# Patient Record
Sex: Female | Born: 1985
Health system: Southern US, Community
[De-identification: ages and names within clinical notes are randomized; demographics above are authoritative.]

## PROBLEM LIST (undated history)

## (undated) ENCOUNTER — Inpatient Hospital Stay (HOSPITAL_COMMUNITY): Payer: Self-pay

## (undated) DIAGNOSIS — E119 Type 2 diabetes mellitus without complications: Secondary | ICD-10-CM

## (undated) HISTORY — PX: WISDOM TOOTH EXTRACTION: SHX21

## (undated) HISTORY — PX: NO PAST SURGERIES: SHX2092

---
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
## 2010-08-08 ENCOUNTER — Inpatient Hospital Stay (HOSPITAL_COMMUNITY): Admit: 2010-08-08 | Payer: Self-pay

## 2012-10-05 DIAGNOSIS — F32A Depression, unspecified: Secondary | ICD-10-CM | POA: Insufficient documentation

## 2012-10-05 DIAGNOSIS — F3163 Bipolar disorder, current episode mixed, severe, without psychotic features: Secondary | ICD-10-CM | POA: Insufficient documentation

## 2013-06-29 ENCOUNTER — Emergency Department (HOSPITAL_BASED_OUTPATIENT_CLINIC_OR_DEPARTMENT_OTHER)
Admission: EM | Admit: 2013-06-29 | Discharge: 2013-06-29 | Disposition: A | Discharge status: Home / Self Care | Payer: Medicaid Other | Attending: Emergency Medicine | Admitting: Emergency Medicine

## 2013-06-29 ENCOUNTER — Emergency Department (HOSPITAL_BASED_OUTPATIENT_CLINIC_OR_DEPARTMENT_OTHER): Payer: Medicaid Other

## 2013-06-29 ENCOUNTER — Encounter (HOSPITAL_BASED_OUTPATIENT_CLINIC_OR_DEPARTMENT_OTHER): Payer: Self-pay | Admitting: Emergency Medicine

## 2013-06-29 DIAGNOSIS — J209 Acute bronchitis, unspecified: Secondary | ICD-10-CM

## 2013-06-29 DIAGNOSIS — J40 Bronchitis, not specified as acute or chronic: Secondary | ICD-10-CM | POA: Insufficient documentation

## 2013-06-29 DIAGNOSIS — J9801 Acute bronchospasm: Secondary | ICD-10-CM | POA: Insufficient documentation

## 2013-06-29 MED ORDER — ALBUTEROL SULFATE HFA 108 (90 BASE) MCG/ACT IN AERS
2.0000 | INHALATION_SPRAY | RESPIRATORY_TRACT | Status: DC | PRN
  Filled 2013-06-29: qty 6.7

## 2013-06-29 MED ORDER — PREDNISONE 5 MG PO TABS: 20.0000 mg | ORAL_TABLET | Freq: Two times a day (BID) | ORAL | Status: DC

## 2013-06-29 MED ORDER — PREDNISONE 20 MG PO TABS
40.0000 mg | ORAL_TABLET | Freq: Once | ORAL | Status: AC
  Administered 2013-06-29: 40 mg via ORAL
  Filled 2013-06-29: qty 2

## 2013-06-29 MED ORDER — IPRATROPIUM-ALBUTEROL 0.5-2.5 (3) MG/3ML IN SOLN
3.0000 mL | RESPIRATORY_TRACT | Status: DC
  Administered 2013-06-29: 3 mL via RESPIRATORY_TRACT
  Filled 2013-06-29: qty 3

## 2013-06-29 MED ORDER — BENZONATATE 100 MG PO CAPS: 200.0000 mg | ORAL_CAPSULE | Freq: Three times a day (TID) | ORAL | Status: DC

## 2013-06-29 NOTE — ED Provider Notes (Signed)
Medical screening examination/treatment/procedure(s) were performed by non-physician practitioner and as supervising physician I was immediately available for consultation/collaboration.   EKG Interpretation None        Richardean Canalavid H Yao, MD 06/29/13 2309

## 2013-06-29 NOTE — ED Provider Notes (Signed)
CSN: [id number]     Arrival date & time 06/29/13  1807 History   First MD Initiated Contact with Patient 06/29/13 1910     Chief Complaint  Patient presents with  . Hoarse  . Shortness of Breath     (Consider location/radiation/quality/duration/timing/severity/associated sxs/prior Treatment) Patient is a 28 y.o. female presenting with shortness of breath. The history is provided by the patient.  Shortness of Breath Severity:  Mild Onset quality:  Gradual Duration:  3 days Timing:  Constant Progression:  Unchanged Chronicity:  New Relieved by:  None tried Worsened by:  Nothing tried Ineffective treatments:  None tried Associated symptoms: cough and sore throat   Associated symptoms: no abdominal pain, no chest pain, no ear pain, no fever, no headaches, no neck pain, no rash and no vomiting   Joanna Neal is a 28 y.o. female who presents to the ED with cough and wheezing that started 3 days ago. She states she started with a sore throat and then the wheezing and feeling short of breath. She denies chest pain, nausea, vomiting or other problems. Her children have been sick as well.   No past medical history on file. No past surgical history on file. No family history on file. History  Substance Use Topics  . Smoking status: Never Smoker   . Smokeless tobacco: Not on file  . Alcohol Use: Not on file   OB History   Grav Para Term Preterm Abortions TAB SAB Ect Mult Living                 Review of Systems  Constitutional: Positive for chills. Negative for fever.  HENT: Positive for congestion and sore throat. Negative for ear pain and trouble swallowing.   Eyes: Negative for visual disturbance.  Respiratory: Positive for cough and shortness of breath.   Cardiovascular: Negative for chest pain.  Gastrointestinal: Negative for nausea, vomiting and abdominal pain.  Genitourinary: Negative for dysuria and frequency.  Musculoskeletal: Negative for back pain and neck pain.   Skin: Negative for rash.  Neurological: Negative for syncope and headaches.  Psychiatric/Behavioral: Negative for confusion.      Allergies  Review of patient's allergies indicates no known allergies.  Home Medications  No current outpatient prescriptions on file. BP 139/73  Pulse 74  Temp(Src) 98.4 F (36.9 C) (Oral)  Resp 16  Ht 5\' 7"  (1.702 m)  Wt 310 lb (140.615 kg)  BMI 48.54 kg/m2  SpO2 100% Physical Exam  Nursing note and vitals reviewed. Constitutional: She is oriented to person, place, and time. She appears well-developed and well-nourished. No distress.  HENT:  Head: Normocephalic and atraumatic.  Right Ear: Tympanic membrane normal.  Left Ear: Tympanic membrane normal.  Nose: Mucosal edema and rhinorrhea present.  Mouth/Throat: Uvula is midline, oropharynx is clear and moist and mucous membranes are normal.  Eyes: EOM are normal.  Neck: Neck supple.  Pulmonary/Chest: Effort normal. She has wheezes. She has no rhonchi. She has no rales.  Abdominal: Soft. There is no tenderness.  Musculoskeletal: Normal range of motion.  Neurological: She is alert and oriented to person, place, and time. No cranial nerve deficit.  Skin: Skin is warm and dry.  Psychiatric: She has a normal mood and affect. Her behavior is normal.    ED Course  Procedures albuterol/atrovent neb. Treatment  After treatment patient feeling better without wheezing.  MDM  28 y.o. female with cough and wheezing x 2 days. Improved with neb treatment. Will d/c home with  albuterol inhaler, prednisone and Tessalon Pearls. She will follow up with her PCP or return here for worsening symptoms.  Discussed with the patient and all questioned fully answered.   Medication List         benzonatate 100 MG capsule  Commonly known as:  TESSALON  Take 2 capsules (200 mg total) by mouth every 8 (eight) hours.     predniSONE 5 MG tablet  Commonly known as:  DELTASONE  Take 4 tablets (20 mg total) by mouth 2  (two) times daily.           [place]anch[place] San DiegoHope M Liddy Deam, [place]NP 06/29/13 2047

## 2013-06-29 NOTE — ED Notes (Signed)
Pt states she started with sore throat on Friday.  Now is hoarse and some difficulty with breathing, feels like she has to take a deep breath to relax.  Some tightness and wheezing at night.

## 2013-06-29 NOTE — ED Notes (Signed)
Duoneb Tx given. BBS clear/ diminished. SPO2 100% on RA, HR 78 RR  16.

## 2013-06-29 NOTE — Discharge Instructions (Signed)
Take the medication as directed. Follow up with your doctor. Return here as needed for worsening symptoms.

## 2013-07-28 ENCOUNTER — Emergency Department (HOSPITAL_BASED_OUTPATIENT_CLINIC_OR_DEPARTMENT_OTHER): Payer: Medicaid Other

## 2013-07-28 ENCOUNTER — Encounter (HOSPITAL_BASED_OUTPATIENT_CLINIC_OR_DEPARTMENT_OTHER): Payer: Self-pay | Admitting: Emergency Medicine

## 2013-07-28 ENCOUNTER — Emergency Department (HOSPITAL_BASED_OUTPATIENT_CLINIC_OR_DEPARTMENT_OTHER)
Admission: EM | Admit: 2013-07-28 | Discharge: 2013-07-28 | Disposition: A | Discharge status: Home / Self Care | Payer: Medicaid Other | Attending: Emergency Medicine | Admitting: Emergency Medicine

## 2013-07-28 DIAGNOSIS — M542 Cervicalgia: Secondary | ICD-10-CM | POA: Insufficient documentation

## 2013-07-28 DIAGNOSIS — M79609 Pain in unspecified limb: Secondary | ICD-10-CM | POA: Insufficient documentation

## 2013-07-28 DIAGNOSIS — J3489 Other specified disorders of nose and nasal sinuses: Secondary | ICD-10-CM | POA: Insufficient documentation

## 2013-07-28 DIAGNOSIS — M79603 Pain in arm, unspecified: Secondary | ICD-10-CM

## 2013-07-28 DIAGNOSIS — R059 Cough, unspecified: Secondary | ICD-10-CM | POA: Insufficient documentation

## 2013-07-28 DIAGNOSIS — R05 Cough: Secondary | ICD-10-CM | POA: Insufficient documentation

## 2013-07-28 LAB — COMPREHENSIVE METABOLIC PANEL
ALT: 20 U/L (ref 0–35)
AST: 17 U/L (ref 0–37)
Albumin: 3.9 g/dL (ref 3.5–5.2)
Alkaline Phosphatase: 114 U/L (ref 39–117)
BILIRUBIN TOTAL: 0.6 mg/dL (ref 0.3–1.2)
BUN: 6 mg/dL (ref 6–23)
CHLORIDE: 102 meq/L (ref 96–112)
CO2: 25 meq/L (ref 19–32)
Calcium: 9.4 mg/dL (ref 8.4–10.5)
Creatinine, Ser: 0.8 mg/dL (ref 0.50–1.10)
GFR calc Af Amer: >90 mL/min (ref >90)
GFR calc non Af Amer: >90 mL/min (ref >90)
Glucose, Bld: 90 mg/dL (ref 70–99)
Potassium: 3.5 mEq/L — ABNORMAL LOW (ref 3.7–5.3)
SODIUM: 142 meq/L (ref 137–147)
Total Protein: 7.4 g/dL (ref 6.0–8.3)

## 2013-07-28 LAB — CBC WITH DIFFERENTIAL/PLATELET
BASOS ABS: 0 10*3/uL (ref 0.0–0.1)
Basophils Relative: 0 % (ref 0–1)
Eosinophils Absolute: 0.4 10*3/uL (ref 0.0–0.7)
Eosinophils Relative: 4 % (ref 0–5)
HEMATOCRIT: 41.3 % (ref 36.0–46.0)
Hemoglobin: 13.6 g/dL (ref 12.0–15.0)
LYMPHS ABS: 2.7 10*3/uL (ref 0.7–4.0)
LYMPHS PCT: 27 % (ref 12–46)
MCH: 29.6 pg (ref 26.0–34.0)
MCHC: 32.9 g/dL (ref 30.0–36.0)
MCV: 89.8 fL (ref 78.0–100.0)
MONO ABS: 0.6 10*3/uL (ref 0.1–1.0)
Monocytes Relative: 6 % (ref 3–12)
Neutro Abs: 6.2 10*3/uL (ref 1.7–7.7)
Neutrophils Relative %: 63 % (ref 43–77)
PLATELETS: 247 10*3/uL (ref 150–400)
RBC: 4.6 MIL/uL (ref 3.87–5.11)
RDW: 13.5 % (ref 11.5–15.5)
WBC: 9.8 10*3/uL (ref 4.0–10.5)

## 2013-07-28 LAB — TROPONIN I: Troponin I: <0.3 ng/mL (ref <0.30)

## 2013-07-28 MED ORDER — IBUPROFEN 800 MG PO TABS: 800.0000 mg | ORAL_TABLET | Freq: Three times a day (TID) | ORAL | Status: DC

## 2013-07-28 MED ORDER — IBUPROFEN 800 MG PO TABS
800.0000 mg | ORAL_TABLET | Freq: Once | ORAL | Status: AC
  Administered 2013-07-28: 800 mg via ORAL
  Filled 2013-07-28: qty 1

## 2013-07-28 NOTE — Discharge Instructions (Signed)
Cervical Radiculopathy Follow up with the [facility] center this week. Take the antiinflammatories as prescribed. Return to the ED if you develop new or worsening symptoms. Cervical radiculopathy happens when a nerve in the neck is pinched or bruised by a slipped (herniated) disk or by arthritic changes in the bones of the cervical spine. This can occur due to an injury or as part of the normal aging process. Pressure on the cervical nerves can cause pain or numbness that runs from your neck all the way down into your arm and fingers. CAUSES  There are many possible causes, including:  Injury.  Muscle tightness in the neck from overuse.  Swollen, painful joints (arthritis).  Breakdown or degeneration in the bones and joints of the spine (spondylosis) due to aging.  Bone spurs that may develop near the cervical nerves. SYMPTOMS  Symptoms include pain, weakness, or numbness in the affected arm and hand. Pain can be severe or irritating. Symptoms may be worse when extending or turning the neck. DIAGNOSIS  Your caregiver will ask about your symptoms and do a physical exam. He or she may test your strength and reflexes. X-rays, CT scans, and MRI scans may be needed in cases of injury or if the symptoms do not go away after a period of time. Electromyography (EMG) or nerve conduction testing may be done to study how your nerves and muscles are working. TREATMENT  Your caregiver may recommend certain exercises to help relieve your symptoms. Cervical radiculopathy can, and often does, get better with time and treatment. If your problems continue, treatment options may include:  Wearing a soft collar for short periods of time.  Physical therapy to strengthen the neck muscles.  Medicines, such as nonsteroidal anti-inflammatory drugs (NSAIDs), oral corticosteroids, or spinal injections.  Surgery. Different types of surgery may be done depending on the cause of your problems. HOME CARE INSTRUCTIONS     Put ice on the affected area.  Put ice in a plastic bag.  Place a towel between your skin and the bag.  Leave the ice on for 15-20 minutes, 03-04 times a day or as directed by your caregiver.  If ice does not help, you can try using heat. Take a warm shower or bath, or use a hot water bottle as directed by your caregiver.  You may try a gentle neck and shoulder massage.  Use a flat pillow when you sleep.  Only take over-the-counter or prescription medicines for pain, discomfort, or fever as directed by your caregiver.  If physical therapy was prescribed, follow your caregiver's directions.  If a soft collar was prescribed, use it as directed. SEEK IMMEDIATE MEDICAL CARE IF:   Your pain gets much worse and cannot be controlled with medicines.  You have weakness or numbness in your hand, arm, face, or leg.  You have a high fever or a stiff, rigid neck.  You lose bowel or bladder control (incontinence).  You have trouble with walking, balance, or speaking. MAKE SURE YOU:   Understand these instructions.  Will watch your condition.  Will get help right away if you are not doing well or get worse. Document Released: 01/10/2001 Document Revised: 07/10/2011 Document Reviewed: 11/29/2010 [facility] Patient Information 2014 [employer], [place].

## 2013-07-28 NOTE — ED Provider Notes (Signed)
CSN: [id number]     Arrival date & time 07/28/13  1658 History  This chart was scribed for Glynn Octave, MD by Danella Maiers, ED Scribe. This patient was seen in room MH08/MH08 and the patient's care was started at 5:12 PM.    Chief Complaint  Patient presents with  . Arm Pain   The history is provided by the patient. No language interpreter was used.   HPI Comments: Joanna Neal is a 28 y.o. female who presents to the Emergency Department complaining of constant, aching right arm pain onset 5 days ago. The pain is located from the mid upper arm down to the hand. She denies injuries or falls. She broke the arm when she was a child but has had no further problems until now. She took a dose of Motrin once and a dose of Benadryl once. She also reports nasal congestion and dry cough. She reports neck pain that she woke up with this morning and states she thinks she slept wrong. She denies back pain, numbness or tingling, headache, difficulty swallowing, CP, or SOB. She is on Implanon birth control. She is not on any other medications.    History reviewed. No pertinent past medical history. History reviewed. No pertinent past surgical history. No family history on file. History  Substance Use Topics  . Smoking status: Never Smoker   . Smokeless tobacco: Not on file  . Alcohol Use: No   OB History   Grav Para Term Preterm Abortions TAB SAB Ect Mult Living                 Review of Systems  HENT: Positive for congestion. Negative for trouble swallowing.   Respiratory: Positive for cough. Negative for shortness of breath.   Cardiovascular: Negative for chest pain.  Musculoskeletal: Positive for neck pain. Negative for back pain.  Neurological: Negative for numbness and headaches.   A complete 10 system review of systems was obtained and all systems are negative except as noted in the HPI and PMH.     Allergies  Review of patient's allergies indicates no known allergies.  Home  Medications   Current Outpatient Rx  Name  Route  Sig  Dispense  Refill  . ibuprofen (ADVIL,MOTRIN) 800 MG tablet   Oral   Take 1 tablet (800 mg total) by mouth 3 (three) times daily.   21 tablet   0    BP 130/79  Pulse 86  Temp(Src) 99.1 F (37.3 C) (Oral)  Resp 18  Ht 5\' 7"  (1.702 m)  Wt 290 lb (131.543 kg)  BMI 45.41 kg/m2  SpO2 100% Physical Exam  Nursing note and vitals reviewed. Constitutional: She is oriented to person, place, and time. She appears well-developed and well-nourished. No distress.  HENT:  Head: Normocephalic and atraumatic.  Mouth/Throat: Oropharynx is clear and moist.  Nasal congestion  Eyes: Conjunctivae and EOM are normal. Pupils are equal, round, and reactive to light.  Neck: Normal range of motion. Neck supple. No tracheal deviation present.  Cardiovascular: Normal rate, regular rhythm and normal heart sounds.   No murmur heard. Pulmonary/Chest: Effort normal. No respiratory distress.  Abdominal: Soft. There is no tenderness. There is no rebound and no guarding.  Musculoskeletal: Normal range of motion. She exhibits no edema and no tenderness.  Neurological: She is alert and oriented to person, place, and time. No cranial nerve deficit. She exhibits normal muscle tone. Coordination normal.  Equal grip strength equal radial pulses. No erythema or appreciable  swelling. Tenderness to palpation of the right dorsal forearm. Equal shoulder shrug. Paraspinal C spine tenderness.   Skin: Skin is warm and dry.  Psychiatric: She has a normal mood and affect. Her behavior is normal.    ED Course  Procedures (including critical care time) Medications  ibuprofen (ADVIL,MOTRIN) tablet 800 mg (800 mg Oral Given 07/28/13 1732)    DIAGNOSTIC STUDIES: Oxygen Saturation is 100% on RA, normal by my interpretation.    COORDINATION OF CARE: 5:15 PM- Discussed treatment plan with pt. Pt agrees to plan.    Labs Review Labs Reviewed  COMPREHENSIVE METABOLIC  PANEL - Abnormal; Notable for the following:    Potassium 3.5 (*)    All other components within normal limits  CBC WITH DIFFERENTIAL  TROPONIN I   Imaging Review Dg Chest 2 View  07/28/2013   CLINICAL DATA:  Cough  EXAM: CHEST  2 VIEW  COMPARISON:  None.  FINDINGS: Lungs are clear. Heart size and pulmonary vascularity are normal. No adenopathy. No bone lesions.  IMPRESSION: No abnormality noted.   Electronically Signed   By: Bretta BangWilliam  Woodruff M.D.   On: 07/28/2013 18:01   Dg Cervical Spine Complete  07/28/2013   CLINICAL DATA:  Pain with right-sided radicular type symptoms  EXAM: CERVICAL SPINE  4+ VIEWS  COMPARISON:  None.  FINDINGS: Frontal, lateral, spot lumbosacral lateral, and bilateral oblique views were obtained. There is no fracture or spondylolisthesis. Prevertebral soft tissues and predental space regions are normal. There is slight disc space narrowing at C4-5. Other disc spaces appear normal. There is no appreciable facet arthropathy. There is mild reversal of lordotic curvature.  IMPRESSION: Reversal of lordotic curvature most likely represents muscle spasm. If there is concern for ligamentous injury, lateral flexion-extension views could be helpful to further assess. No fracture or spondylolisthesis. Minimal disc space narrowing C 4-5.   Electronically Signed   By: Bretta BangWilliam  Woodruff M.D.   On: 07/28/2013 18:01     EKG Interpretation   Date/Time:  Monday July 28 2013 17:28:14 EDT Ventricular Rate:  80 PR Interval:  130 QRS Duration: 82 QT Interval:  354 QTC Calculation: 408 R Axis:   63 Text Interpretation:  Normal sinus rhythm with sinus arrhythmia  Nonspecific T wave abnormality Abnormal ECG No previous ECGs available  Confirmed by Manus GunningANCOUR  MD, Manreet Kiernan [phone]([id number]) on 07/28/2013 5:37:14 PM      MDM   Final diagnoses:  Arm pain   Right forearm pain for the past 5 days without injury. No focal weakness, numbness or tingling. Equal grip strength and equal radial pulses  bilaterally.  nasal congestion for the past several days as well.  No chest pain or shortness of breath. No hypoxia or tachycardia.  X-rays are negative for any acute fracture dislocation of neck. Mild disc space narrowing seen. EKG is normal sinus rhythm. Chest x-ray is negative. Patient is equal grip strength bilaterally. Equal radial pulses.  Suspect pain likely secondary to nerve impingement. We'll treat with anti-inflammatories followup with PCP. Return precautions discussed..  BP 130/79  Pulse 86  Temp(Src) 99.1 F (37.3 C) (Oral)  Resp 18  Ht 5\' 7"  (1.702 m)  Wt 290 lb (131.543 kg)  BMI 45.41 kg/m2  SpO2 100%    I personally performed the services described in this documentation, which was scribed in my presence. The recorded information has been reviewed and is accurate.   Glynn OctaveStephen Lonn Im, MD 07/29/13 [phone]

## 2013-07-28 NOTE — ED Notes (Signed)
Right arm pain from her mid upper arm down to her hand x 5 days. No injury.

## 2016-01-18 ENCOUNTER — Encounter (HOSPITAL_BASED_OUTPATIENT_CLINIC_OR_DEPARTMENT_OTHER): Payer: Self-pay | Admitting: Emergency Medicine

## 2016-04-29 ENCOUNTER — Emergency Department (HOSPITAL_BASED_OUTPATIENT_CLINIC_OR_DEPARTMENT_OTHER): Payer: Medicaid Other

## 2016-04-29 ENCOUNTER — Emergency Department (HOSPITAL_BASED_OUTPATIENT_CLINIC_OR_DEPARTMENT_OTHER)
Admission: EM | Admit: 2016-04-29 | Discharge: 2016-04-29 | Disposition: A | Discharge status: Home / Self Care | Payer: Medicaid Other | Attending: Emergency Medicine | Admitting: Emergency Medicine

## 2016-04-29 ENCOUNTER — Encounter (HOSPITAL_BASED_OUTPATIENT_CLINIC_OR_DEPARTMENT_OTHER): Payer: Self-pay | Admitting: Emergency Medicine

## 2016-04-29 DIAGNOSIS — J209 Acute bronchitis, unspecified: Secondary | ICD-10-CM

## 2016-04-29 DIAGNOSIS — R109 Unspecified abdominal pain: Secondary | ICD-10-CM | POA: Insufficient documentation

## 2016-04-29 DIAGNOSIS — Z79899 Other long term (current) drug therapy: Secondary | ICD-10-CM | POA: Diagnosis not present

## 2016-04-29 DIAGNOSIS — J40 Bronchitis, not specified as acute or chronic: Secondary | ICD-10-CM | POA: Diagnosis not present

## 2016-04-29 DIAGNOSIS — R05 Cough: Secondary | ICD-10-CM | POA: Diagnosis present

## 2016-04-29 MED ORDER — BENZONATATE 100 MG PO CAPS: 100.0000 mg | ORAL_CAPSULE | Freq: Three times a day (TID) | ORAL | 0 refills | Status: DC

## 2016-04-29 MED ORDER — BENZONATATE 100 MG PO CAPS
100.0000 mg | ORAL_CAPSULE | Freq: Once | ORAL | Status: AC
  Administered 2016-04-29: 100 mg via ORAL
  Filled 2016-04-29: qty 1

## 2016-04-29 MED ORDER — DEXAMETHASONE SODIUM PHOSPHATE 10 MG/ML IJ SOLN
10.0000 mg | Freq: Once | INTRAMUSCULAR | Status: AC
  Administered 2016-04-29: 10 mg via INTRAMUSCULAR
  Filled 2016-04-29: qty 1

## 2016-04-29 MED ORDER — ALBUTEROL SULFATE HFA 108 (90 BASE) MCG/ACT IN AERS
1.0000 | INHALATION_SPRAY | Freq: Four times a day (QID) | RESPIRATORY_TRACT | 0 refills | Status: DC | PRN
Start: 2016-04-29 — End: 2019-04-09

## 2016-04-29 MED ORDER — FLUTICASONE PROPIONATE 50 MCG/ACT NA SUSP: 2.0000 | Freq: Every day | NASAL | 2 refills | Status: DC

## 2016-04-29 MED ORDER — IPRATROPIUM-ALBUTEROL 0.5-2.5 (3) MG/3ML IN SOLN
3.0000 mL | Freq: Four times a day (QID) | RESPIRATORY_TRACT | Status: DC
  Administered 2016-04-29: 3 mL via RESPIRATORY_TRACT
  Filled 2016-04-29: qty 3

## 2016-04-29 NOTE — Discharge Instructions (Signed)
Urine x-ray shows no signs of pneumonia. This is likely a viral bronchitis that is causing bronchospasms. I have given you a prescription for an albuterol inhaler to use as needed for wheezing. May use the Flonase nasal spray for nasal decongestant. DM with a nasal decongestant as well. Drink plenty of fluids. Get plenty of rest. Follow-up with her primary care doctor in regards to today's visit. Return to the ED if you develop any worsening symptoms.

## 2016-04-29 NOTE — ED Provider Notes (Signed)
[facility]   CSN: [id number] Arrival date & time: 04/29/16  0905     History   Chief Complaint Chief Complaint  Patient presents with  . Sore Throat  . Cough    HPI Joanna Neal is a 30 y.o. female.  30 year old African-American female past medical history significant for bronchitis presents to the ED today with multiple complaints including cough, sore throat, wheezing, rhinorrhea, sinus congestion. Patient states that her symptoms started approximately 2 weeks ago. She was treated for the flu approximately 3 weeks ago. Endorses sick contacts. All of her kids have had the flu and bronchitis. She states that she is not coughing anything up. She has been taking several over-the-counter medications including Vicks, TheraFlu, Tylenol, ibuprofen with some relief. Patient states the doctor 3 weeks ago gave her cough medicine that helped get mucus out of her chest. She also states that her chest hurts secondary from coughing. Denies any fever, chills, headache, vision changes, neck pain, neck stiffness, lightheadedness, dizziness, shortness of breath, abdominal pain, nausea, emesis, urinary symptoms, change in bowel habits, numbness/tingling. Patient states that she quit smoking approximately 2 years ago. She was a multi pack year smoker before that. States that she has been diagnosed with bronchitis in the past and needed a albuterol inhaler.      History reviewed. No pertinent past medical history.  There are no active problems to display for this patient.   History reviewed. No pertinent surgical history.  OB History    No data available       Home Medications    Prior to Admission medications   Medication Sig Start Date End Date Taking? Authorizing Provider  benzonatate (TESSALON) 100 MG capsule Take 2 capsules (200 mg total) by mouth every 8 (eight) hours. 06/29/13   Hope Orlene OchM Neese, NP  ibuprofen (ADVIL,MOTRIN) 800 MG tablet Take 1 tablet (800 mg  total) by mouth 3 (three) times daily. 07/28/13   Glynn OctaveStephen Rancour, MD  predniSONE (DELTASONE) 5 MG tablet Take 4 tablets (20 mg total) by mouth 2 (two) times daily. 06/29/13   Hope Orlene OchM Neese, NP    Family History History reviewed. No pertinent family history.  Social History Social History  Substance Use Topics  . Smoking status: Never Smoker  . Smokeless tobacco: Never Used  . Alcohol use No     Allergies   Patient has no known allergies.   Review of Systems Review of Systems  Constitutional: Negative for chills and fever.  HENT: Positive for congestion, postnasal drip, rhinorrhea, sinus pain, sinus pressure, sore throat and trouble swallowing.   Eyes: Negative for visual disturbance.  Respiratory: Positive for cough and wheezing. Negative for shortness of breath.   Cardiovascular: Negative for chest pain.  Gastrointestinal: Positive for abdominal pain. Negative for diarrhea, nausea and vomiting.  Genitourinary: Negative for dysuria, flank pain, hematuria and urgency.  Musculoskeletal: Negative for myalgias, neck pain and neck stiffness.  Skin: Negative.   Neurological: Negative for dizziness, weakness, light-headedness and headaches.  All other systems reviewed and are negative.    Physical Exam Updated Vital Signs BP 130/93 (BP Location: Right Arm)   Pulse 64   Temp 98.8 F (37.1 C) (Oral)   Resp 18   Ht 5\' 7"  (1.702 m)   Wt 77.1 kg   SpO2 100%   BMI 26.63 kg/m   Physical Exam  Constitutional: She is oriented to person, place, and time. She appears well-developed and well-nourished. No distress.  HENT:  Head: Normocephalic and atraumatic.  Right Ear: Tympanic membrane, external ear and ear canal normal.  Left Ear: Tympanic membrane, external ear and ear canal normal.  Nose: Mucosal edema and rhinorrhea present. Right sinus exhibits no maxillary sinus tenderness and no frontal sinus tenderness. Left sinus exhibits no maxillary sinus tenderness and no frontal sinus  tenderness.  Mouth/Throat: Uvula is midline and mucous membranes are normal. No trismus in the jaw. Posterior oropharyngeal edema and posterior oropharyngeal erythema present. No oropharyngeal exudate or tonsillar abscesses. Tonsils are 1+ on the right. Tonsils are 1+ on the left. No tonsillar exudate.  Eyes: Conjunctivae are normal. Pupils are equal, round, and reactive to light. Right eye exhibits no discharge. Left eye exhibits no discharge. No scleral icterus.  Neck: Normal range of motion. Neck supple.  Full range of motion without any nuchal rigidity.  Cardiovascular: Normal rate and regular rhythm.   Pulmonary/Chest: Effort normal and breath sounds normal. No respiratory distress.  Lungs clear to auscultation bilaterally. Diminished breath sounds throughout. Patient is not hypoxic or tachypneic  Musculoskeletal: Normal range of motion.  Lymphadenopathy:    She has no cervical adenopathy.  Neurological: She is alert and oriented to person, place, and time.  Skin: Skin is warm and dry. Capillary refill takes less than 2 seconds. No pallor.  Nursing note and vitals reviewed.    ED Treatments / Results  Labs (all labs ordered are listed, but only abnormal results are displayed) Labs Reviewed - No data to display  EKG  EKG Interpretation None       Radiology No results found.  Procedures Procedures (including critical care time)  Medications Ordered in ED Medications  ipratropium-albuterol (DUONEB) 0.5-2.5 (3) MG/3ML nebulizer solution 3 mL (not administered)  benzonatate (TESSALON) capsule 100 mg (not administered)  dexamethasone (DECADRON) injection 10 mg (not administered)     Initial Impression / Assessment and Plan / ED Course  I have reviewed the triage vital signs and the nursing notes.  Pertinent labs & imaging results that were available during my care of the patient were reviewed by me and considered in my medical decision making (see chart for  details).  Clinical Course   Patient resents to the ED with multiple complaints including cough, sinus congestion, rhinorrhea, wheezing. Chest x-ray shows no infiltrate. Breath sounds were diminished bilaterally. However no wheezing or rhonchi were noted. DuoNeb was given with significant improvement in patient's symptoms. Exam is consistent with a viral bronchitis. Patient has history of same. Patient given steroids in the ED for sore throat. We'll discharge home with albuterol inhaler, Flonase, Tessalon. Patient is afebrile and not tachycardic in the ED. Her vital signs are stable. Pt is hemodynamically stable, in NAD, & able to ambulate in the ED. Pain has been managed & has no complaints prior to dc. Pt is comfortable with above plan and is stable for discharge at this time. All questions were answered prior to disposition. Strict return precautions for f/u to the ED were discussed.   Final Clinical Impressions(s) / ED Diagnoses   Final diagnoses:  Bronchitis with bronchospasm    New Prescriptions New Prescriptions   ALBUTEROL (PROVENTIL HFA;VENTOLIN HFA) 108 (90 BASE) MCG/ACT INHALER    Inhale 1-2 puffs into the lungs every 6 (six) hours as needed for wheezing or shortness of breath.   BENZONATATE (TESSALON) 100 MG CAPSULE    Take 1 capsule (100 mg total) by mouth every 8 (eight) hours.   FLUTICASONE (FLONASE) 50 MCG/ACT NASAL SPRAY  Place 2 sprays into both nostrils daily.     Rise Mu, PA-C 04/29/16 1033    Rolland Porter, MD 05/13/16 [phone]

## 2016-04-29 NOTE — ED Triage Notes (Signed)
Pt c/o cough, sore throat and wheezing x approx 2 wks

## 2016-09-10 ENCOUNTER — Emergency Department (HOSPITAL_BASED_OUTPATIENT_CLINIC_OR_DEPARTMENT_OTHER): Payer: Medicaid Other

## 2016-09-10 ENCOUNTER — Emergency Department (HOSPITAL_BASED_OUTPATIENT_CLINIC_OR_DEPARTMENT_OTHER)
Admission: EM | Admit: 2016-09-10 | Discharge: 2016-09-10 | Disposition: A | Discharge status: Home / Self Care | Payer: Medicaid Other | Attending: Emergency Medicine | Admitting: Emergency Medicine

## 2016-09-10 DIAGNOSIS — Y92410 Unspecified street and highway as the place of occurrence of the external cause: Secondary | ICD-10-CM | POA: Insufficient documentation

## 2016-09-10 DIAGNOSIS — Y939 Activity, unspecified: Secondary | ICD-10-CM | POA: Insufficient documentation

## 2016-09-10 DIAGNOSIS — S39012A Strain of muscle, fascia and tendon of lower back, initial encounter: Secondary | ICD-10-CM | POA: Insufficient documentation

## 2016-09-10 DIAGNOSIS — Y999 Unspecified external cause status: Secondary | ICD-10-CM | POA: Insufficient documentation

## 2016-09-10 DIAGNOSIS — S3992XA Unspecified injury of lower back, initial encounter: Secondary | ICD-10-CM | POA: Diagnosis present

## 2016-09-10 DIAGNOSIS — Z79899 Other long term (current) drug therapy: Secondary | ICD-10-CM | POA: Insufficient documentation

## 2016-09-10 DIAGNOSIS — Z7984 Long term (current) use of oral hypoglycemic drugs: Secondary | ICD-10-CM | POA: Diagnosis not present

## 2016-09-10 LAB — PREGNANCY, URINE: Preg Test, Ur: NEGATIVE

## 2016-09-10 MED ORDER — CYCLOBENZAPRINE HCL 10 MG PO TABS: 10.0000 mg | ORAL_TABLET | Freq: Two times a day (BID) | ORAL | 0 refills | Status: DC | PRN

## 2016-09-10 MED ORDER — IBUPROFEN 600 MG PO TABS: 600.0000 mg | ORAL_TABLET | Freq: Four times a day (QID) | ORAL | 0 refills | Status: DC | PRN

## 2016-09-10 NOTE — ED Notes (Signed)
Patient transported to X-ray 
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
## 2016-09-10 NOTE — ED Notes (Signed)
Pt teaching provided on medications that may cause drowsiness. Pt instructed not to drive or operate heavy machinery while taking the prescribed medication. Pt verbalized understanding.   

## 2016-09-10 NOTE — ED Notes (Signed)
Alert, NAD, calm, interactive, resps e/u, speaking in clear complete sentences, no dyspnea noted, skin W&D, c/o back pain upper, mid and lower R>L, and R trapezius area (denies: sob, nausea, dizziness or visual changes), [facility] "No meds [facility]".

## 2016-09-10 NOTE — ED Notes (Signed)
Pt given cup to attempt a urine sample. 
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
## 2017-03-21 ENCOUNTER — Encounter (HOSPITAL_BASED_OUTPATIENT_CLINIC_OR_DEPARTMENT_OTHER): Payer: Self-pay | Admitting: Emergency Medicine

## 2017-03-21 ENCOUNTER — Emergency Department (HOSPITAL_BASED_OUTPATIENT_CLINIC_OR_DEPARTMENT_OTHER): Payer: Medicaid Other

## 2017-03-21 ENCOUNTER — Other Ambulatory Visit: Payer: Self-pay

## 2017-03-21 ENCOUNTER — Emergency Department (HOSPITAL_BASED_OUTPATIENT_CLINIC_OR_DEPARTMENT_OTHER)
Admission: EM | Admit: 2017-03-21 | Discharge: 2017-03-21 | Disposition: A | Discharge status: Home / Self Care | Payer: Medicaid Other | Attending: Emergency Medicine | Admitting: Emergency Medicine

## 2017-03-21 DIAGNOSIS — Z7984 Long term (current) use of oral hypoglycemic drugs: Secondary | ICD-10-CM | POA: Insufficient documentation

## 2017-03-21 DIAGNOSIS — Z79899 Other long term (current) drug therapy: Secondary | ICD-10-CM | POA: Diagnosis not present

## 2017-03-21 DIAGNOSIS — J40 Bronchitis, not specified as acute or chronic: Secondary | ICD-10-CM | POA: Diagnosis not present

## 2017-03-21 DIAGNOSIS — R05 Cough: Secondary | ICD-10-CM | POA: Diagnosis present

## 2017-03-21 MED ORDER — PREDNISONE 50 MG PO TABS
60.0000 mg | ORAL_TABLET | Freq: Once | ORAL | Status: AC
  Administered 2017-03-21: 60 mg via ORAL
  Filled 2017-03-21: qty 1

## 2017-03-21 MED ORDER — ALBUTEROL SULFATE HFA 108 (90 BASE) MCG/ACT IN AERS
2.0000 | INHALATION_SPRAY | Freq: Once | RESPIRATORY_TRACT | Status: AC
  Administered 2017-03-21: 2 via RESPIRATORY_TRACT
  Filled 2017-03-21: qty 6.7

## 2017-03-21 MED ORDER — AZITHROMYCIN 250 MG PO TABS: 250.0000 mg | ORAL_TABLET | Freq: Every day | ORAL | 0 refills | Status: DC

## 2017-03-21 MED ORDER — PREDNISONE 20 MG PO TABS: ORAL_TABLET | ORAL | 0 refills | Status: DC

## 2017-03-21 MED FILL — predniSONE 20 MG TABS: 20 | 6 days supply | Qty: 12 | Fill #0

## 2017-03-21 MED FILL — AZITHROMYCIN 250 MG TABLET: 250 | 5 days supply | Qty: 6 | Fill #0

## 2017-03-21 NOTE — ED Provider Notes (Signed)
[facility] EMERGENCY DEPARTMENT Provider Note   CSN: [id number] Arrival date & time: 03/21/17  1342     History   Chief Complaint Chief Complaint  Patient presents with  . Cough    HPI Joanna Neal is a 31 y.o. female he presented with cough, congestion.  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  She states that she has been having coughing for the last month or so.  Saw her primary care doctor and given a shot of antibiotics and finished a course of steroids end of October that helped with her symptoms.  She states that she felt better but still had a residual cough for the last several weeks.  For the last several days, the cough is more productive and she had some low-grade temperatures at home but no fevers.  She states that her chest is sore from all the coughing.  She denies being pregnant and is on oral contraceptives.  The history is provided by the patient.    History reviewed. No pertinent past medical history.  There are no active problems to display for this patient.   History reviewed. No pertinent surgical history.  OB History    No data available       Home Medications    Prior to Admission medications   Medication Sig Start Date End Date Taking? Authorizing Provider  albuterol (PROVENTIL HFA;VENTOLIN HFA) 108 (90 Base) MCG/ACT inhaler Inhale 1-2 puffs into the lungs every 6 (six) hours as needed for wheezing or shortness of breath. 04/29/16   Rise MuLeaphart, Kenneth T, PA-C  cyclobenzaprine (FLEXERIL) 10 MG tablet Take 1 tablet (10 mg total) by mouth 2 (two) times daily as needed for muscle spasms. 09/10/16   Rolan BuccoBelfi, Melanie, MD  ibuprofen (ADVIL,MOTRIN) 600 MG tablet Take 1 tablet (600 mg total) by mouth every 6 (six) hours as needed. 09/10/16   Rolan BuccoBelfi, Melanie, MD  metFORMIN (GLUCOPHAGE) 1000 MG tablet Take 1,000 mg by mouth 2 (two) times daily with a meal.    [provider]  phentermine 30 MG capsule  Take 30 mg by mouth every morning.    [provider]  topiramate (TOPAMAX) 100 MG tablet Take 100 mg by mouth 2 (two) times daily.    [provider]    Family History No family history on file.  Social History Social History   Tobacco Use  . Smoking status: Never Smoker  . Smokeless tobacco: Never Used  Substance Use Topics  . Alcohol use: No  . Drug use: No     Allergies   Patient has no known allergies.   Review of Systems Review of Systems  Respiratory: Positive for cough.   All other systems reviewed and are negative.    Physical Exam Updated Vital Signs BP 125/89 (BP Location: Left Arm)   Pulse 95   Temp 98 F (36.7 C) (Oral)   Resp 18   Ht 5\' 7"  (1.702 m)   Wt 125.6 kg (277 lb)   SpO2 99%   BMI 43.38 kg/m   Physical Exam  Constitutional: She is oriented to person, place, and time. She appears well-developed.  HENT:  Head: Normocephalic.  Eyes: Conjunctivae and EOM are normal. Pupils are equal, round, and reactive to light.  Neck: Normal range of motion. Neck supple.  Cardiovascular: Normal rate, regular rhythm and normal heart sounds.  Pulmonary/Chest: Effort normal.  Diminished throughout, no obvious wheezing or crackles   Abdominal:  Soft. Bowel sounds are normal. She exhibits no distension. There is no tenderness.  Musculoskeletal: Normal range of motion. She exhibits no edema.  Neurological: She is alert and oriented to person, place, and time. No cranial nerve deficit. Coordination normal.  Skin: Skin is warm.  Psychiatric: She has a normal mood and affect.  Nursing note and vitals reviewed.    ED Treatments / Results  Labs (all labs ordered are listed, but only abnormal results are displayed) Labs Reviewed - No data to display  EKG  EKG Interpretation None       Radiology Dg Chest 2 View  Result Date: 03/21/2017 CLINICAL DATA:  Back cough for over a month, congestion EXAM: CHEST  2 VIEW COMPARISON:  04/29/2016  FINDINGS: Normal heart size, mediastinal contours, and pulmonary vascularity. Lungs clear. No pleural effusion or pneumothorax. Bones unremarkable. IMPRESSION: Normal exam. Electronically Signed   By: Ulyses SouthwardMark  Boles M.D.   On: 03/21/2017 14:05    Procedures Procedures (including critical care time)  Medications Ordered in ED Medications  predniSONE (DELTASONE) tablet 60 mg (not administered)  albuterol (PROVENTIL HFA;VENTOLIN HFA) 108 (90 Base) MCG/ACT inhaler 2 puff (not administered)     Initial Impression / Assessment and Plan / ED Course  I have reviewed the triage vital signs and the nursing notes.  Pertinent labs & imaging results that were available during my care of the patient were reviewed by me and considered in my medical decision making (see chart for details).     Joanna Neal is a 31 y.o. female here with cough. She is around school age kids and likely can get atypical organisms. No fever in the ED. Diminished breath sounds bilaterally, not hypoxic or tachycardic. CXR clear. Will dc home with prednisone, zpack, albuterol for possible bronchitis.   Final Clinical Impressions(s) / ED Diagnoses   Final diagnoses:  None    ED Discharge Orders    None       Charlynne PanderYao, David Hsienta, MD 03/21/17 [phone]

## 2017-03-21 NOTE — Discharge Instructions (Signed)
Take prednisone as prescribed.   Take zpack as prescribed.   Use albuterol every 4-6 hrs as needed for wheezing   See your doctor  Return to ER if you have worse cough, wheezing, trouble breathing, fever.

## 2017-03-21 NOTE — ED Triage Notes (Addendum)
PT presents with cough for over a month. PT states she was given steroids around Oct 30th without relief. Pt reports she is now having chest soreness.

## 2017-04-11 ENCOUNTER — Emergency Department (HOSPITAL_BASED_OUTPATIENT_CLINIC_OR_DEPARTMENT_OTHER)
Admission: EM | Admit: 2017-04-11 | Discharge: 2017-04-11 | Disposition: A | Discharge status: Home / Self Care | Payer: Medicaid Other | Attending: Emergency Medicine | Admitting: Emergency Medicine

## 2017-04-11 ENCOUNTER — Emergency Department (HOSPITAL_BASED_OUTPATIENT_CLINIC_OR_DEPARTMENT_OTHER): Payer: Medicaid Other

## 2017-04-11 ENCOUNTER — Encounter (HOSPITAL_BASED_OUTPATIENT_CLINIC_OR_DEPARTMENT_OTHER): Payer: Self-pay | Admitting: Emergency Medicine

## 2017-04-11 ENCOUNTER — Other Ambulatory Visit: Payer: Self-pay

## 2017-04-11 DIAGNOSIS — Z79899 Other long term (current) drug therapy: Secondary | ICD-10-CM | POA: Diagnosis not present

## 2017-04-11 DIAGNOSIS — Z7984 Long term (current) use of oral hypoglycemic drugs: Secondary | ICD-10-CM | POA: Insufficient documentation

## 2017-04-11 DIAGNOSIS — B9789 Other viral agents as the cause of diseases classified elsewhere: Secondary | ICD-10-CM | POA: Insufficient documentation

## 2017-04-11 DIAGNOSIS — J029 Acute pharyngitis, unspecified: Secondary | ICD-10-CM | POA: Diagnosis not present

## 2017-04-11 DIAGNOSIS — J309 Allergic rhinitis, unspecified: Secondary | ICD-10-CM | POA: Diagnosis not present

## 2017-04-11 DIAGNOSIS — R0981 Nasal congestion: Secondary | ICD-10-CM | POA: Diagnosis not present

## 2017-04-11 DIAGNOSIS — M791 Myalgia, unspecified site: Secondary | ICD-10-CM | POA: Insufficient documentation

## 2017-04-11 DIAGNOSIS — E119 Type 2 diabetes mellitus without complications: Secondary | ICD-10-CM | POA: Diagnosis not present

## 2017-04-11 DIAGNOSIS — R067 Sneezing: Secondary | ICD-10-CM | POA: Diagnosis not present

## 2017-04-11 DIAGNOSIS — R05 Cough: Secondary | ICD-10-CM | POA: Diagnosis present

## 2017-04-11 DIAGNOSIS — J069 Acute upper respiratory infection, unspecified: Secondary | ICD-10-CM | POA: Insufficient documentation

## 2017-04-11 HISTORY — DX: Type 2 diabetes mellitus without complications: E11.9

## 2017-04-11 LAB — RAPID STREP SCREEN (MED CTR MEBANE ONLY): Streptococcus, Group A Screen (Direct): NEGATIVE

## 2017-04-11 MED ORDER — FLUTICASONE PROPIONATE 50 MCG/ACT NA SUSP: 2.0000 | Freq: Every day | NASAL | 0 refills | Status: DC

## 2017-04-11 MED ORDER — CETIRIZINE HCL 10 MG PO TABS: 10.0000 mg | ORAL_TABLET | Freq: Every day | ORAL | 1 refills | Status: DC

## 2017-04-11 MED ORDER — BENZONATATE 100 MG PO CAPS: 100.0000 mg | ORAL_CAPSULE | Freq: Three times a day (TID) | ORAL | 0 refills | Status: DC | PRN

## 2017-04-11 NOTE — ED Triage Notes (Signed)
Cough for over a week, body aches started yesterday. Also endorses sore throat.

## 2017-04-11 NOTE — ED Provider Notes (Signed)
[facility] EMERGENCY DEPARTMENT Provider Note   CSN: [id number] Arrival date & time: 04/11/17  1141     History   Chief Complaint Chief Complaint  Patient presents with  . Cough  . Sore Throat    HPI Joanna Neal is a 31 y.o. female.  Joanna Neal is a 31 y.o. Female who presents to the emergency department complaining of sneezing, nasal congestion, sore throat and coughing.  She reports she has had postnasal drip, sneezing and runny nose over the past 2 weeks with associated cough.  She also reports developing some body aches yesterday.  She denies fevers.  She reports she had her flu shot this year.  No treatments attempted prior to arrival.  She denies fevers, shortness of breath, chest pain, abdominal pain, nausea, vomiting, trouble swallowing, or rashes.   The history is provided by the patient and medical records. No language interpreter was used.  Cough  Associated symptoms include rhinorrhea and sore throat. Pertinent negatives include no chest pain, no chills, no headaches, no shortness of breath and no wheezing.  Sore Throat  Pertinent negatives include no chest pain, no abdominal pain, no headaches and no shortness of breath.    Past Medical History:  Diagnosis Date  . Diabetes mellitus without complication (HCC)     There are no active problems to display for this patient.   History reviewed. No pertinent surgical history.  OB History    No data available       Home Medications    Prior to Admission medications   Medication Sig Start Date End Date Taking? Authorizing Provider  metFORMIN (GLUCOPHAGE) 1000 MG tablet Take 1,000 mg by mouth 2 (two) times daily with a meal.   Yes [provider]  phentermine 30 MG capsule Take 30 mg by mouth every morning.   Yes [provider]  topiramate (TOPAMAX) 100 MG tablet Take 100 mg by mouth 2 (two) times daily.   Yes [provider]  albuterol (PROVENTIL HFA;VENTOLIN  HFA) 108 (90 Base) MCG/ACT inhaler Inhale 1-2 puffs into the lungs every 6 (six) hours as needed for wheezing or shortness of breath. 04/29/16   Rise Mu, PA-C  benzonatate (TESSALON) 100 MG capsule Take 1 capsule (100 mg total) by mouth 3 (three) times daily as needed for cough. 04/11/17   Everlene Farrier, PA-C  cetirizine (ZYRTEC ALLERGY) 10 MG tablet Take 1 tablet (10 mg total) by mouth daily. 04/11/17   Everlene Farrier, PA-C  cyclobenzaprine (FLEXERIL) 10 MG tablet Take 1 tablet (10 mg total) by mouth 2 (two) times daily as needed for muscle spasms. 09/10/16   Rolan Bucco, MD  fluticasone (FLONASE) 50 MCG/ACT nasal spray Place 2 sprays into both nostrils daily. 04/11/17   Everlene Farrier, PA-C  ibuprofen (ADVIL,MOTRIN) 600 MG tablet Take 1 tablet (600 mg total) by mouth every 6 (six) hours as needed. 09/10/16   Rolan Bucco, MD    Family History No family history on file.  Social History Social History   Tobacco Use  . Smoking status: Never Smoker  . Smokeless tobacco: Never Used  Substance Use Topics  . Alcohol use: No  . Drug use: No     Allergies   Patient has no known allergies.   Review of Systems Review of Systems  Constitutional: Negative for chills and fever.  HENT: Positive for congestion, postnasal drip, rhinorrhea, sinus pressure, sneezing and sore throat. Negative for trouble swallowing and voice change.   Eyes:  Negative for visual disturbance.  Respiratory: Positive for cough. Negative for shortness of breath and wheezing.   Cardiovascular: Negative for chest pain.  Gastrointestinal: Negative for abdominal pain, diarrhea, nausea and vomiting.  Genitourinary: Negative for dysuria.  Musculoskeletal: Negative for neck pain.  Skin: Negative for rash.  Neurological: Negative for headaches.     Physical Exam Updated Vital Signs BP (!) 132/92   Pulse 79   Temp 99.4 F (37.4 C) (Oral)   Resp 18   SpO2 99%   Physical Exam  Constitutional: She  appears well-developed and well-nourished.  Non-toxic appearance. She does not appear ill. No distress.  HENT:  Head: Normocephalic and atraumatic.  Right Ear: A middle ear effusion is present.  Left Ear: A middle ear effusion is present.  Mouth/Throat: No uvula swelling. Posterior oropharyngeal erythema present. No oropharyngeal exudate, posterior oropharyngeal edema or tonsillar abscesses. Tonsils are 0 on the right. Tonsils are 0 on the left. No tonsillar exudate.  Mild clear middle ear effusion bilaterally without TM erythema or loss of landmarks. Mild posterior oropharyngeal erythema without edema.  No tonsillar hypertrophy or exudate.  No PTA.  No drooling. Rhinorrhea and boggy nasal turbinates bilaterally.  Eyes: Conjunctivae are normal. Pupils are equal, round, and reactive to light. Right eye exhibits no discharge. Left eye exhibits no discharge.  Neck: Neck supple.  Cardiovascular: Normal rate, regular rhythm, normal heart sounds and intact distal pulses. Exam reveals no gallop and no friction rub.  No murmur heard. Pulmonary/Chest: Effort normal and breath sounds normal. No stridor. No respiratory distress. She has no wheezes. She has no rhonchi. She has no rales.  Lungs are clear to ascultation bilaterally. Symmetric chest expansion bilaterally. No increased work of breathing. No rales or rhonchi.    Abdominal: Soft. There is no tenderness.  Musculoskeletal: She exhibits no edema.  Lymphadenopathy:    She has no cervical adenopathy.  Neurological: She is alert. Coordination normal.  Skin: Skin is warm and dry. No rash noted. She is not diaphoretic. No erythema. No pallor.  Psychiatric: She has a normal mood and affect. Her behavior is normal.  Nursing note and vitals reviewed.    ED Treatments / Results  Labs (all labs ordered are listed, but only abnormal results are displayed) Labs Reviewed  RAPID STREP SCREEN (NOT AT [facility])  CULTURE, GROUP A STREP [facility])    EKG  EKG  Interpretation None       Radiology Dg Chest 2 View  Result Date: 04/11/2017 CLINICAL DATA:  Cough x2 weeks with chest pain and body aches since yesterday. EXAM: CHEST  2 VIEW COMPARISON:  03/21/2017 FINDINGS: The heart size and mediastinal contours are within normal limits. Both lungs are clear. The visualized skeletal structures are unremarkable. IMPRESSION: Stable normal exam of the chest. Electronically Signed   By: Tollie Ethavid  Kwon M.D.   On: 04/11/2017 12:30    Procedures Procedures (including critical care time)  Medications Ordered in ED Medications - No data to display   Initial Impression / Assessment and Plan / ED Course  I have reviewed the triage vital signs and the nursing notes.  Pertinent labs & imaging results that were available during my care of the patient were reviewed by me and considered in my medical decision making (see chart for details).    This is a 31 y.o. Female who presents to the emergency department complaining of sneezing, nasal congestion, sore throat and coughing.  She reports she has had postnasal drip, sneezing and runny  nose over the past 2 weeks with associated cough.  She also reports developing some body aches yesterday.  She denies fevers.  She reports she had her flu shot this year.  No treatments attempted prior to arrival.  On exam the patient is afebrile nontoxic-appearing.  No tachypnea or hypoxia.  Lungs are clear to auscultation bilaterally.  Rhinorrhea is present.  Chest x-ray obtained in triage is unremarkable.  Rapid strep screen is negative.  Patient with upper respiratory infection.  Will discharge with prescription for Flonase, Zyrtec and Tessalon Perles.  Return precautions discussed. I advised the patient to follow-up with their primary care provider this week. I advised the patient to return to the emergency department with new or worsening symptoms or new concerns. The patient verbalized understanding and agreement with plan.      Final Clinical Impressions(s) / ED Diagnoses   Final diagnoses:  Viral URI with cough  Viral pharyngitis  Allergic rhinitis, unspecified seasonality, unspecified trigger    ED Discharge Orders        Ordered    fluticasone (FLONASE) 50 MCG/ACT nasal spray  Daily     04/11/17 1402    cetirizine (ZYRTEC ALLERGY) 10 MG tablet  Daily     04/11/17 1402    benzonatate (TESSALON) 100 MG capsule  3 times daily PRN     04/11/17 1402       Everlene FarrierDansie, Doreatha Offer, PA-C 04/11/17 1415    Benjiman CorePickering, Nathan, MD 04/11/17 [phone]

## 2017-04-14 LAB — CULTURE, GROUP A STREP (THRC)

## 2017-05-13 ENCOUNTER — Emergency Department (HOSPITAL_BASED_OUTPATIENT_CLINIC_OR_DEPARTMENT_OTHER): Payer: Medicaid Other

## 2017-05-13 ENCOUNTER — Emergency Department (HOSPITAL_BASED_OUTPATIENT_CLINIC_OR_DEPARTMENT_OTHER)
Admission: EM | Admit: 2017-05-13 | Discharge: 2017-05-13 | Disposition: A | Discharge status: Home / Self Care | Payer: Medicaid Other | Attending: Emergency Medicine | Admitting: Emergency Medicine

## 2017-05-13 ENCOUNTER — Other Ambulatory Visit: Payer: Self-pay

## 2017-05-13 ENCOUNTER — Encounter (HOSPITAL_BASED_OUTPATIENT_CLINIC_OR_DEPARTMENT_OTHER): Payer: Self-pay | Admitting: *Deleted

## 2017-05-13 DIAGNOSIS — M545 Low back pain, unspecified: Secondary | ICD-10-CM

## 2017-05-13 DIAGNOSIS — Z3202 Encounter for pregnancy test, result negative: Secondary | ICD-10-CM | POA: Diagnosis not present

## 2017-05-13 DIAGNOSIS — R0602 Shortness of breath: Secondary | ICD-10-CM | POA: Diagnosis not present

## 2017-05-13 DIAGNOSIS — Z79899 Other long term (current) drug therapy: Secondary | ICD-10-CM | POA: Insufficient documentation

## 2017-05-13 DIAGNOSIS — Z7984 Long term (current) use of oral hypoglycemic drugs: Secondary | ICD-10-CM | POA: Insufficient documentation

## 2017-05-13 DIAGNOSIS — N390 Urinary tract infection, site not specified: Secondary | ICD-10-CM | POA: Insufficient documentation

## 2017-05-13 DIAGNOSIS — E119 Type 2 diabetes mellitus without complications: Secondary | ICD-10-CM | POA: Insufficient documentation

## 2017-05-13 LAB — CBC WITH DIFFERENTIAL/PLATELET
BASOS ABS: 0 10*3/uL (ref 0.0–0.1)
Basophils Relative: 1 %
Eosinophils Absolute: 0 10*3/uL (ref 0.0–0.7)
Eosinophils Relative: 1 %
HEMATOCRIT: 40 % (ref 36.0–46.0)
Hemoglobin: 13.3 g/dL (ref 12.0–15.0)
LYMPHS PCT: 38 %
Lymphs Abs: 2.1 10*3/uL (ref 0.7–4.0)
MCH: 29.8 pg (ref 26.0–34.0)
MCHC: 33.3 g/dL (ref 30.0–36.0)
MCV: 89.5 fL (ref 78.0–100.0)
Monocytes Absolute: 0.4 10*3/uL (ref 0.1–1.0)
Monocytes Relative: 7 %
Neutro Abs: 2.9 10*3/uL (ref 1.7–7.7)
Neutrophils Relative %: 53 %
Platelets: 222 10*3/uL (ref 150–400)
RBC: 4.47 MIL/uL (ref 3.87–5.11)
RDW: 13.7 % (ref 11.5–15.5)
WBC: 5.4 10*3/uL (ref 4.0–10.5)

## 2017-05-13 LAB — URINALYSIS, MICROSCOPIC (REFLEX)

## 2017-05-13 LAB — URINALYSIS, ROUTINE W REFLEX MICROSCOPIC
BILIRUBIN URINE: NEGATIVE
Glucose, UA: NEGATIVE mg/dL
Hgb urine dipstick: NEGATIVE
Ketones, ur: 15 mg/dL — AB
Nitrite: NEGATIVE
PH: 7 (ref 5.0–8.0)
Protein, ur: 30 mg/dL — AB
SPECIFIC GRAVITY, URINE: 1.025 (ref 1.005–1.030)

## 2017-05-13 LAB — COMPREHENSIVE METABOLIC PANEL
ALT: 18 U/L (ref 14–54)
AST: 18 U/L (ref 15–41)
Albumin: 4.2 g/dL (ref 3.5–5.0)
Alkaline Phosphatase: 62 U/L (ref 38–126)
Anion gap: 8 (ref 5–15)
BILIRUBIN TOTAL: 1.4 mg/dL — AB (ref 0.3–1.2)
BUN: 12 mg/dL (ref 6–20)
CO2: 21 mmol/L — ABNORMAL LOW (ref 22–32)
CREATININE: 0.89 mg/dL (ref 0.44–1.00)
Calcium: 9.1 mg/dL (ref 8.9–10.3)
Chloride: 107 mmol/L (ref 101–111)
GFR calc Af Amer: >60 mL/min (ref >60)
Glucose, Bld: 89 mg/dL (ref 65–99)
Potassium: 3.5 mmol/L (ref 3.5–5.1)
Sodium: 136 mmol/L (ref 135–145)
TOTAL PROTEIN: 7.4 g/dL (ref 6.5–8.1)

## 2017-05-13 LAB — PREGNANCY, URINE: Preg Test, Ur: NEGATIVE

## 2017-05-13 LAB — LIPASE, BLOOD: LIPASE: 23 U/L (ref 11–51)

## 2017-05-13 MED ORDER — CEPHALEXIN 500 MG PO CAPS: 500.0000 mg | ORAL_CAPSULE | Freq: Two times a day (BID) | ORAL | 0 refills | Status: AC

## 2017-05-13 NOTE — ED Triage Notes (Signed)
Back pain, SHOB, cramps and vomiting x 1 week. "Feel like I'm walking like I'm pregnant." Denies urinary s/s.

## 2017-05-13 NOTE — ED Provider Notes (Signed)
[facility] EMERGENCY DEPARTMENT Provider Note   CSN: [id number] Arrival date & time: 05/13/17  1630     History   Chief Complaint Chief Complaint  Patient presents with  . Back Pain    HPI Joanna Neal is a 32 y.o. female.  HPI  Reports back pain, lower back around hips, sometimes radiates up towards upper back.  Reports worse with moving around. Feels like she is walking hunched over because of the pain. Sometimes can't get out of bed, because pain so severe.  Has also had abdominal pain, nausea, vomiting, shortness of breath.  Back pain started last week.  Lower abdominal cramping started last week as well.  Nausea and vomiting, was yesterday morning only.  Headache started yesterday, improved and had another today.  Low appetite. No fevers. No cough.  No chest pain.  Dyspnea has been going on with lower back pain for last week, notes it with exertion, not sure if it is because of pain.  Felt this way in the past when pregnant.  LMP was thanksgiving.  Reports has implanon but it is time to take it out, had it placed 2010.  No dysuria.  No increased frequency. No hx of DVT or PE, no leg pain or swelling. Took ibuprofen at [facility] today, feels improved.  No vaginal discharge, no concern for STI.     Past Medical History:  Diagnosis Date  . Diabetes mellitus without complication (HCC)     There are no active problems to display for this patient.   History reviewed. No pertinent surgical history.  OB History    No data available       Home Medications    Prior to Admission medications   Medication Sig Start Date End Date Taking? Authorizing Provider  omeprazole (PRILOSEC) 20 MG capsule Take 20 mg by mouth daily.   Yes [provider]  albuterol (PROVENTIL HFA;VENTOLIN HFA) 108 (90 Base) MCG/ACT inhaler Inhale 1-2 puffs into the lungs every 6 (six) hours as needed for wheezing or shortness of breath. 04/29/16   Rise Mu, PA-C  benzonatate  (TESSALON) 100 MG capsule Take 1 capsule (100 mg total) by mouth 3 (three) times daily as needed for cough. 04/11/17   Everlene Farrier, PA-C  cephALEXin (KEFLEX) 500 MG capsule Take 1 capsule (500 mg total) by mouth 2 (two) times daily for 10 days. 05/13/17 05/23/17  Alvira Monday, MD  cetirizine (ZYRTEC ALLERGY) 10 MG tablet Take 1 tablet (10 mg total) by mouth daily. 04/11/17   Everlene Farrier, PA-C  cyclobenzaprine (FLEXERIL) 10 MG tablet Take 1 tablet (10 mg total) by mouth 2 (two) times daily as needed for muscle spasms. 09/10/16   Rolan Bucco, MD  fluticasone (FLONASE) 50 MCG/ACT nasal spray Place 2 sprays into both nostrils daily. 04/11/17   Everlene Farrier, PA-C  ibuprofen (ADVIL,MOTRIN) 600 MG tablet Take 1 tablet (600 mg total) by mouth every 6 (six) hours as needed. 09/10/16   Rolan Bucco, MD  metFORMIN (GLUCOPHAGE) 1000 MG tablet Take 1,000 mg by mouth 2 (two) times daily with a meal.    [provider]  phentermine 30 MG capsule Take 30 mg by mouth every morning.    [provider]  topiramate (TOPAMAX) 100 MG tablet Take 100 mg by mouth 2 (two) times daily.    [provider]    Family History History reviewed. No pertinent family history.  Social History Social History   Tobacco Use  . Smoking status:  Never Smoker  . Smokeless tobacco: Never Used  Substance Use Topics  . Alcohol use: No  . Drug use: No     Allergies   Patient has no known allergies.   Review of Systems Review of Systems  Constitutional: Negative for fever.  HENT: Negative for congestion and tinnitus.   Eyes: Negative for visual disturbance.  Respiratory: Positive for shortness of breath.   Cardiovascular: Negative for chest pain and leg swelling.  Gastrointestinal: Positive for abdominal pain, constipation, nausea and vomiting. Negative for diarrhea.  Genitourinary: Negative for difficulty urinating, dysuria, vaginal bleeding and vaginal discharge.    Musculoskeletal: Positive for back pain and neck pain.  Neurological: Positive for headaches. Negative for facial asymmetry, weakness and numbness.     Physical Exam Updated Vital Signs BP 133/89   Pulse 69   Temp 98.7 F (37.1 C) (Oral)   Resp 16   SpO2 100%   Physical Exam  Constitutional: She is oriented to person, place, and time. She appears well-developed and well-nourished. No distress.  HENT:  Head: Normocephalic and atraumatic.  Eyes: Conjunctivae and EOM are normal.  Neck: Normal range of motion.  Cardiovascular: Normal rate, regular rhythm, normal heart sounds and intact distal pulses. Exam reveals no gallop and no friction rub.  No murmur heard. Pulmonary/Chest: Effort normal and breath sounds normal. No respiratory distress. She has no wheezes. She has no rales.  Abdominal: Soft. She exhibits no distension. There is no tenderness. There is no guarding.  Musculoskeletal: She exhibits no edema or tenderness.  Neurological: She is alert and oriented to person, place, and time. No cranial nerve deficit or sensory deficit. She exhibits normal muscle tone. Coordination normal.  Skin: Skin is warm and dry. No rash noted. She is not diaphoretic. No erythema.  Nursing note and vitals reviewed.    ED Treatments / Results  Labs (all labs ordered are listed, but only abnormal results are displayed) Labs Reviewed  URINALYSIS, ROUTINE W REFLEX MICROSCOPIC - Abnormal; Notable for the following components:      Result Value   Color, Urine AMBER (*)    APPearance HAZY (*)    Ketones, ur 15 (*)    Protein, ur 30 (*)    Leukocytes, UA TRACE (*)    All other components within normal limits  URINALYSIS, MICROSCOPIC (REFLEX) - Abnormal; Notable for the following components:   Bacteria, UA MANY (*)    Squamous Epithelial / LPF 6-30 (*)    All other components within normal limits  COMPREHENSIVE METABOLIC PANEL - Abnormal; Notable for the following components:   CO2 21 (*)     Total Bilirubin 1.4 (*)    All other components within normal limits  PREGNANCY, URINE  CBC WITH DIFFERENTIAL/PLATELET  LIPASE, BLOOD    EKG  EKG Interpretation None       Radiology Dg Chest 2 View  Result Date: 05/13/2017 CLINICAL DATA:  Abdominal cramping and shortness of breath for 1 week EXAM: CHEST  2 VIEW COMPARISON:  April 11, 2017 FINDINGS: The heart size and mediastinal contours are within normal limits. Both lungs are clear. The visualized skeletal structures are unremarkable. IMPRESSION: No active cardiopulmonary disease. Electronically Signed   By: Sherian ReinWei-Chen  Lin M.D.   On: 05/13/2017 18:47    Procedures Procedures (including critical care time)  Medications Ordered in ED Medications - No data to display   Initial Impression / Assessment and Plan / ED Course  I have reviewed the triage vital signs and the  nursing notes.  Pertinent labs & imaging results that were available during my care of the patient were reviewed by me and considered in my medical decision making (see chart for details).     31yo female presenting with multiple concerns, including back pain, vomiting, suprapubic pain, headaches.  Reports dyspnea, XR shows no acute findings, normal vital signs, given mutiple other concerns as well and mild symptoms doubt PE, or other emergent etiologies with symptoms possibly secondary to pain.  Discussed possibility of pelvic exam, however given back pain primary concern, findings of UTI, no vaginal discharge, agree lower suspicion for PID at this time and feel outpt follow up appropriate. Labs show no significant abnormalities. No red flags of back pain. Slow onset headache, no fever, doubt meningitis or SAH.  Suspect most likely etiology of symptoms is UTI causing nausea, back pain, suprapubic pain.  Given rx for keflex.  Patient discharged in stable condition with understanding of reasons to return.   Final Clinical Impressions(s) / ED Diagnoses   Final  diagnoses:  Urinary tract infection without hematuria, site unspecified  Acute bilateral low back pain without sciatica    ED Discharge Orders        Ordered    cephALEXin (KEFLEX) 500 MG capsule  2 times daily     05/13/17 1854       Alvira Monday, MD 05/14/17 1423

## 2017-08-13 ENCOUNTER — Emergency Department (HOSPITAL_BASED_OUTPATIENT_CLINIC_OR_DEPARTMENT_OTHER): Payer: Medicaid Other

## 2017-08-13 ENCOUNTER — Other Ambulatory Visit: Payer: Self-pay

## 2017-08-13 ENCOUNTER — Emergency Department (HOSPITAL_BASED_OUTPATIENT_CLINIC_OR_DEPARTMENT_OTHER)
Admission: EM | Admit: 2017-08-13 | Discharge: 2017-08-13 | Disposition: A | Discharge status: Home / Self Care | Payer: Medicaid Other | Attending: Emergency Medicine | Admitting: Emergency Medicine

## 2017-08-13 ENCOUNTER — Encounter (HOSPITAL_BASED_OUTPATIENT_CLINIC_OR_DEPARTMENT_OTHER): Payer: Self-pay | Admitting: Emergency Medicine

## 2017-08-13 DIAGNOSIS — K5900 Constipation, unspecified: Secondary | ICD-10-CM | POA: Insufficient documentation

## 2017-08-13 DIAGNOSIS — R109 Unspecified abdominal pain: Secondary | ICD-10-CM | POA: Insufficient documentation

## 2017-08-13 DIAGNOSIS — E119 Type 2 diabetes mellitus without complications: Secondary | ICD-10-CM | POA: Insufficient documentation

## 2017-08-13 DIAGNOSIS — Z7984 Long term (current) use of oral hypoglycemic drugs: Secondary | ICD-10-CM | POA: Diagnosis not present

## 2017-08-13 LAB — URINALYSIS, ROUTINE W REFLEX MICROSCOPIC
BILIRUBIN URINE: NEGATIVE
GLUCOSE, UA: NEGATIVE mg/dL
Ketones, ur: NEGATIVE mg/dL
Leukocytes, UA: NEGATIVE
Nitrite: NEGATIVE
PROTEIN: 30 mg/dL — AB
Specific Gravity, Urine: 1.015 (ref 1.005–1.030)
pH: 8.5 — ABNORMAL HIGH (ref 5.0–8.0)

## 2017-08-13 LAB — COMPREHENSIVE METABOLIC PANEL
ALBUMIN: 4.4 g/dL (ref 3.5–5.0)
ALT: 19 U/L (ref 14–54)
ANION GAP: 6 (ref 5–15)
AST: 20 U/L (ref 15–41)
Alkaline Phosphatase: 58 U/L (ref 38–126)
BILIRUBIN TOTAL: 1.2 mg/dL (ref 0.3–1.2)
BUN: 9 mg/dL (ref 6–20)
CALCIUM: 9 mg/dL (ref 8.9–10.3)
CHLORIDE: 109 mmol/L (ref 101–111)
CO2: 21 mmol/L — AB (ref 22–32)
Creatinine, Ser: 0.83 mg/dL (ref 0.44–1.00)
GFR calc Af Amer: >60 mL/min (ref >60)
GFR calc non Af Amer: >60 mL/min (ref >60)
GLUCOSE: 83 mg/dL (ref 65–99)
POTASSIUM: 3.6 mmol/L (ref 3.5–5.1)
Sodium: 136 mmol/L (ref 135–145)
TOTAL PROTEIN: 7.4 g/dL (ref 6.5–8.1)

## 2017-08-13 LAB — CBC WITH DIFFERENTIAL/PLATELET
BASOS ABS: 0 10*3/uL (ref 0.0–0.1)
BASOS PCT: 0 %
EOS ABS: 0 10*3/uL (ref 0.0–0.7)
Eosinophils Relative: 1 %
HEMATOCRIT: 43.2 % (ref 36.0–46.0)
Hemoglobin: 14.4 g/dL (ref 12.0–15.0)
Lymphocytes Relative: 45 %
Lymphs Abs: 2.5 10*3/uL (ref 0.7–4.0)
MCH: 30.1 pg (ref 26.0–34.0)
MCHC: 33.3 g/dL (ref 30.0–36.0)
MCV: 90.4 fL (ref 78.0–100.0)
MONO ABS: 0.4 10*3/uL (ref 0.1–1.0)
MONOS PCT: 7 %
NEUTROS ABS: 2.6 10*3/uL (ref 1.7–7.7)
Neutrophils Relative %: 47 %
PLATELETS: 218 10*3/uL (ref 150–400)
RBC: 4.78 MIL/uL (ref 3.87–5.11)
RDW: 13.1 % (ref 11.5–15.5)
WBC: 5.6 10*3/uL (ref 4.0–10.5)

## 2017-08-13 LAB — URINALYSIS, MICROSCOPIC (REFLEX)

## 2017-08-13 LAB — PREGNANCY, URINE: PREG TEST UR: NEGATIVE

## 2017-08-13 LAB — MAGNESIUM: MAGNESIUM: 2.1 mg/dL (ref 1.7–2.4)

## 2017-08-13 LAB — LIPASE, BLOOD: LIPASE: 26 U/L (ref 11–51)

## 2017-08-13 MED ORDER — SODIUM CHLORIDE 0.9 % IV BOLUS
1000.0000 mL | Freq: Once | INTRAVENOUS | Status: AC
  Administered 2017-08-13: 1000 mL via INTRAVENOUS

## 2017-08-13 MED ORDER — DOCUSATE SODIUM 100 MG PO CAPS
100.0000 mg | ORAL_CAPSULE | Freq: Once | ORAL | Status: AC
  Administered 2017-08-13: 100 mg via ORAL
  Filled 2017-08-13: qty 1

## 2017-08-13 MED ORDER — SENNOSIDES-DOCUSATE SODIUM 8.6-50 MG PO TABS: ORAL_TABLET | ORAL | 0 refills | Status: DC

## 2017-08-13 MED FILL — SM STOOL SOFTENER-LAXATIVE: 8.6-50 | 25 days supply | Qty: 100 | Fill #0

## 2017-08-13 NOTE — ED Provider Notes (Signed)
[facility] EMERGENCY DEPARTMENT Provider Note   CSN: [id number] Arrival date & time: 08/13/17  0825     History   Chief Complaint Chief Complaint  Patient presents with  . Abdominal Pain     HPI   Blood pressure (!) 136/94, pulse 90, temperature 98.7 F (37.1 C), temperature source Oral, resp. rate 18, SpO2 100 %.  Joanna Neal is a 32 y.o. female complaining of severe diffuse abdominal pain worsening over the course of several weeks, she is not passing stool normally, she is passing flatus normally there is been no prior abdominal surgeries.  She feels nausea with no vomiting.  The pain is colicky and sometimes very severe, she has not taken any pain medication prior to arrival.  She states that she was seen at her primary care last week and had an ultrasound of her abdomen but will not have those results back till the first.  She denies fevers, chills, emesis, abnormal vaginal discharge, change in urination.  She states that she feels generally weak because she has not eaten in days.   Past Medical History:  Diagnosis Date  . Diabetes mellitus without complication (HCC)     There are no active problems to display for this patient.   History reviewed. No pertinent surgical history.   OB History   None      Home Medications    Prior to Admission medications   Medication Sig Start Date End Date Taking? Authorizing Provider  albuterol (PROVENTIL HFA;VENTOLIN HFA) 108 (90 Base) MCG/ACT inhaler Inhale 1-2 puffs into the lungs every 6 (six) hours as needed for wheezing or shortness of breath. 04/29/16   Rise Mu, PA-C  benzonatate (TESSALON) 100 MG capsule Take 1 capsule (100 mg total) by mouth 3 (three) times daily as needed for cough. 04/11/17   Everlene Farrier, PA-C  cetirizine (ZYRTEC ALLERGY) 10 MG tablet Take 1 tablet (10 mg total) by mouth daily. 04/11/17   Everlene Farrier, PA-C  cyclobenzaprine (FLEXERIL) 10 MG tablet Take 1 tablet (10 mg  total) by mouth 2 (two) times daily as needed for muscle spasms. 09/10/16   Rolan Bucco, MD  fluticasone (FLONASE) 50 MCG/ACT nasal spray Place 2 sprays into both nostrils daily. 04/11/17   Everlene Farrier, PA-C  ibuprofen (ADVIL,MOTRIN) 600 MG tablet Take 1 tablet (600 mg total) by mouth every 6 (six) hours as needed. 09/10/16   Rolan Bucco, MD  metFORMIN (GLUCOPHAGE) 1000 MG tablet Take 1,000 mg by mouth 2 (two) times daily with a meal.    [provider]  omeprazole (PRILOSEC) 20 MG capsule Take 20 mg by mouth daily.    [provider]  phentermine 30 MG capsule Take 30 mg by mouth every morning.    [provider]  senna-docusate (SENOKOT-S) 8.6-50 MG tablet Start 1 tab QHS, up to 2tabs BID. Max 4 tabs daily for constipation 08/13/17   Aranda Bihm, Joni Reining, PA-C  topiramate (TOPAMAX) 100 MG tablet Take 100 mg by mouth 2 (two) times daily.    [provider]    Family History No family history on file.  Social History Social History   Tobacco Use  . Smoking status: Never Smoker  . Smokeless tobacco: Never Used  Substance Use Topics  . Alcohol use: No  . Drug use: No     Allergies   Patient has no known allergies.   Review of Systems Review of Systems  A complete review of systems was obtained and all systems  are negative except as noted in the HPI and PMH.   Physical Exam Updated Vital Signs BP 132/79 (BP Location: Left Arm)   Pulse 77   Temp 98.7 F (37.1 C) (Oral)   Resp 18   SpO2 100%   Physical Exam  Constitutional: She is oriented to person, place, and time. She appears well-developed and well-nourished. No distress.  HENT:  Head: Normocephalic and atraumatic.  Mouth/Throat: Oropharynx is clear and moist.  Eyes: Pupils are equal, round, and reactive to light. Conjunctivae and EOM are normal.  Neck: Normal range of motion.  Cardiovascular: Normal rate, regular rhythm and intact distal pulses.  Pulmonary/Chest: Effort normal  and breath sounds normal. No stridor.  Abdominal: Soft. Bowel sounds are normal. She exhibits no distension and no mass. There is no tenderness. There is no rebound and no guarding. No hernia.  No tenderness to deep palpation of any quadrant.  Genitourinary:  Genitourinary Comments: No CVA tenderness to percussion bilaterally  Musculoskeletal: Normal range of motion.  Neurological: She is alert and oriented to person, place, and time.  Skin: She is not diaphoretic.  Psychiatric: She has a normal mood and affect.  Nursing note and vitals reviewed.    ED Treatments / Results  Labs (all labs ordered are listed, but only abnormal results are displayed) Labs Reviewed  URINALYSIS, ROUTINE W REFLEX MICROSCOPIC - Abnormal; Notable for the following components:      Result Value   pH 8.5 (*)    Hgb urine dipstick TRACE (*)    Protein, ur 30 (*)    All other components within normal limits  COMPREHENSIVE METABOLIC PANEL - Abnormal; Notable for the following components:   CO2 21 (*)    All other components within normal limits  URINALYSIS, MICROSCOPIC (REFLEX) - Abnormal; Notable for the following components:   Bacteria, UA RARE (*)    Squamous Epithelial / LPF 6-30 (*)    All other components within normal limits  PREGNANCY, URINE  CBC WITH DIFFERENTIAL/PLATELET  LIPASE, BLOOD  MAGNESIUM    EKG None  Radiology Dg Abdomen Acute W/chest  Result Date: 08/13/2017 CLINICAL DATA:  Abdominal pain, bloating and constipation for 2 weeks. EXAM: DG ABDOMEN ACUTE W/ 1V CHEST COMPARISON:  Radiographs of May 13, 2017. FINDINGS: There is no evidence of dilated bowel loops or free intraperitoneal air. No radiopaque calculi or other significant radiographic abnormality is seen. Heart size and mediastinal contours are within normal limits. Both lungs are clear. No significant stool burden is noted. IMPRESSION: No evidence of bowel obstruction or ileus. No acute cardiopulmonary disease.  Electronically Signed   By: Lupita Raider, M.D.   On: 08/13/2017 12:13    Procedures Procedures (including critical care time)  Medications Ordered in ED Medications  sodium chloride 0.9 % bolus 1,000 mL (1,000 mLs Intravenous New Bag/Given 08/13/17 1147)  docusate sodium (COLACE) capsule 100 mg (100 mg Oral Given 08/13/17 1149)     Initial Impression / Assessment and Plan / ED Course  I have reviewed the triage vital signs and the nursing notes.  Pertinent labs & imaging results that were available during my care of the patient were reviewed by me and considered in my medical decision making (see chart for details).     Vitals:   08/13/17 0842 08/13/17 1209  BP: (!) 136/94 132/79  Pulse: 90 77  Resp: 18 18  Temp: 98.7 F (37.1 C)   TempSrc: Oral   SpO2: 100% 100%    Medications  sodium chloride 0.9 % bolus 1,000 mL (1,000 mLs Intravenous New Bag/Given 08/13/17 1147)  docusate sodium (COLACE) capsule 100 mg (100 mg Oral Given 08/13/17 1149)    Melvyn NovasBrittany L Dabney is 32 y.o. female presenting with intermittent colicky and diffuse abdominal pain with constipation, nausea no vomiting.  Abdominal exam benign and reassuring.  Vital signs reassuring.  [facility], it was a general abdominal ultrasound with no abnormalities.  Blood work and urinalysis today reassuring with no significant abnormalities, acute abdominal series with no significant stool burden and a nonobstructive bowel gas pattern.  Repeat abdominal exam remains benign.  I think this patient may have a small amount of constipation, will treat for such and recommend close follow-up with PCP.  Evaluation does not show pathology that would require ongoing emergent intervention or inpatient treatment. Pt is hemodynamically stable and mentating appropriately. Discussed findings and plan with patient/guardian, who agrees with care plan. All questions answered. Return precautions  discussed and outpatient follow up given.      Final Clinical Impressions(s) / ED Diagnoses   Final diagnoses:  Constipation, unspecified constipation type    ED Discharge Orders        Ordered    senna-docusate (SENOKOT-S) 8.6-50 MG tablet     08/13/17 1221       Randall Colden, Mardella Laymanicole, PA-C 08/13/17 1222    Rolland PorterJames, Mark, MD 08/14/17 [phone]

## 2017-08-13 NOTE — ED Triage Notes (Signed)
PT presents with c/o abdominal pain that she has been having for a couple weeks. PT states she has not had BM in a couple weeks and was seen at her PCP and had ultrasound but does not have the results.

## 2017-08-13 NOTE — Discharge Instructions (Addendum)
Take magnesium citrate 150mg every 12 hours. You should continue to take Senakot. If this you have no relief in the the next 24 hours you should start taking miralax until you have a bowel movement. You may also try a fleet enema which are available over -the counter ° ° °Please follow with your primary care doctor in the next 2 days for a check-up. They must obtain records for further management.  ° °Do not hesitate to return to the Emergency Department for any new, worsening or concerning symptoms.  ° °

## 2017-11-03 ENCOUNTER — Other Ambulatory Visit: Payer: Self-pay

## 2017-11-03 ENCOUNTER — Encounter (HOSPITAL_BASED_OUTPATIENT_CLINIC_OR_DEPARTMENT_OTHER): Payer: Self-pay | Admitting: Emergency Medicine

## 2017-11-03 ENCOUNTER — Emergency Department (HOSPITAL_BASED_OUTPATIENT_CLINIC_OR_DEPARTMENT_OTHER)
Admission: EM | Admit: 2017-11-03 | Discharge: 2017-11-03 | Disposition: A | Discharge status: Home / Self Care | Payer: Medicaid Other | Attending: Emergency Medicine | Admitting: Emergency Medicine

## 2017-11-03 DIAGNOSIS — Z79899 Other long term (current) drug therapy: Secondary | ICD-10-CM | POA: Insufficient documentation

## 2017-11-03 DIAGNOSIS — M545 Low back pain, unspecified: Secondary | ICD-10-CM

## 2017-11-03 DIAGNOSIS — Z7984 Long term (current) use of oral hypoglycemic drugs: Secondary | ICD-10-CM | POA: Insufficient documentation

## 2017-11-03 DIAGNOSIS — E119 Type 2 diabetes mellitus without complications: Secondary | ICD-10-CM | POA: Insufficient documentation

## 2017-11-03 MED ORDER — METHOCARBAMOL 500 MG PO TABS: 500.0000 mg | ORAL_TABLET | Freq: Two times a day (BID) | ORAL | 0 refills | Status: DC

## 2017-11-03 NOTE — ED Notes (Signed)
Pt teaching provided on medications that may cause drowsiness. Pt instructed not to drive or operate heavy machinery while taking the prescribed medication. Pt verbalized understanding.   
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
## 2018-01-13 ENCOUNTER — Emergency Department (HOSPITAL_BASED_OUTPATIENT_CLINIC_OR_DEPARTMENT_OTHER): Payer: Medicaid Other

## 2018-01-13 ENCOUNTER — Emergency Department (HOSPITAL_BASED_OUTPATIENT_CLINIC_OR_DEPARTMENT_OTHER)
Admission: EM | Admit: 2018-01-13 | Discharge: 2018-01-13 | Disposition: A | Discharge status: Home / Self Care | Payer: Medicaid Other | Attending: Emergency Medicine | Admitting: Emergency Medicine

## 2018-01-13 ENCOUNTER — Encounter (HOSPITAL_BASED_OUTPATIENT_CLINIC_OR_DEPARTMENT_OTHER): Payer: Self-pay | Admitting: *Deleted

## 2018-01-13 ENCOUNTER — Other Ambulatory Visit: Payer: Self-pay

## 2018-01-13 DIAGNOSIS — E119 Type 2 diabetes mellitus without complications: Secondary | ICD-10-CM | POA: Diagnosis not present

## 2018-01-13 DIAGNOSIS — Z79899 Other long term (current) drug therapy: Secondary | ICD-10-CM | POA: Insufficient documentation

## 2018-01-13 DIAGNOSIS — Z7984 Long term (current) use of oral hypoglycemic drugs: Secondary | ICD-10-CM | POA: Diagnosis not present

## 2018-01-13 DIAGNOSIS — R059 Cough, unspecified: Secondary | ICD-10-CM

## 2018-01-13 DIAGNOSIS — R05 Cough: Secondary | ICD-10-CM | POA: Insufficient documentation

## 2018-01-13 DIAGNOSIS — J0100 Acute maxillary sinusitis, unspecified: Secondary | ICD-10-CM | POA: Diagnosis not present

## 2018-01-13 DIAGNOSIS — R0981 Nasal congestion: Secondary | ICD-10-CM | POA: Diagnosis present

## 2018-01-13 LAB — GROUP A STREP BY PCR: GROUP A STREP BY PCR: NOT DETECTED

## 2018-01-13 MED ORDER — BENZONATATE 100 MG PO CAPS
100.0000 mg | ORAL_CAPSULE | Freq: Three times a day (TID) | ORAL | 0 refills | Status: DC
Start: 2018-01-13 — End: 2018-06-22

## 2018-01-13 MED ORDER — GUAIFENESIN ER 1200 MG PO TB12: 1.0000 | ORAL_TABLET | Freq: Two times a day (BID) | ORAL | 1 refills | Status: DC | PRN

## 2018-01-13 MED ORDER — AMOXICILLIN-POT CLAVULANATE 875-125 MG PO TABS: 1.0000 | ORAL_TABLET | Freq: Two times a day (BID) | ORAL | 0 refills | Status: DC

## 2018-01-13 NOTE — Discharge Instructions (Signed)
Please take all of your antibiotics until finished!   You may develop abdominal discomfort or diarrhea from the antibiotic.  You may help offset this with probiotics which you can buy or get in yogurt. Do not eat or take the probiotics until 2 hours after your antibiotic. Do not take your medicine if develop an itchy rash, swelling in your mouth or lips, or difficulty breathing.   Take Mucinex as needed for sinus pressure and congestion.  Take Tessalon as needed for cough  Follow up with your primary care this week.   If you develop worsening or new concerning symptoms you can return to the emergency department for re-evaluation.

## 2018-01-13 NOTE — ED Triage Notes (Signed)
Coughing x 2 weeks, sore throat, head colds, vomited d/t coughing

## 2018-01-13 NOTE — ED Provider Notes (Signed)
[facility] EMERGENCY DEPARTMENT Provider Note   CSN: [id number] Arrival date & time: 01/13/18  [id number]     History   Chief Complaint Chief Complaint  Patient presents with  . Cough    HPI Joanna Neal is a 32 y.o. female with a history of diabetes who presents emergency department today for URI symptoms.  Patient reports that over the last 2 weeks she has developed nasal congestion, sinus pressure, rhinorrhea, postnasal drip, sore throat with associated dysphasia, productive cough with green sputum.  She reports that her symptoms have been ongoing for the last 2 weeks and worsened 1 week ago and stayed constant.  She reports she does have sick contacts including her son who had strep throat last week.  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  She reports she did have one episode of vomiting approximately 1.5 weeks ago after coughing.  This is not recurred.  She reports all of her children are up-to-date on immunizations.  She denies periods of apnea after coughing.  No recent travel.  She denies any chest pain, shortness of breath, hemoptysis, lower extremity swelling.  She is tried over-the-counter cold medication for symptoms with out any relief.  She denies any pain at this current time.  She denies any allergies, sneezing, itchy watery eyes, rash.  HPI  Past Medical History:  Diagnosis Date  . Diabetes mellitus without complication (HCC)     There are no active problems to display for this patient.   History reviewed. No pertinent surgical history.   OB History    Gravida  3   Para  3   Term      Preterm      AB      Living        SAB      TAB      Ectopic      Multiple      Live Births               Home Medications    Prior to Admission medications   Medication Sig Start Date End Date Taking? Authorizing Provider  metFORMIN (GLUCOPHAGE) 1000 MG tablet Take 1,000 mg by mouth 2 (two)  times daily with a meal.   Yes [provider]  omeprazole (PRILOSEC) 20 MG capsule Take 20 mg by mouth daily.   Yes [provider]  phentermine 30 MG capsule Take 30 mg by mouth every morning.   Yes [provider]  topiramate (TOPAMAX) 100 MG tablet Take 100 mg by mouth 2 (two) times daily.   Yes [provider]  albuterol (PROVENTIL HFA;VENTOLIN HFA) 108 (90 Base) MCG/ACT inhaler Inhale 1-2 puffs into the lungs every 6 (six) hours as needed for wheezing or shortness of breath. 04/29/16   Rise MuLeaphart, Kenneth T, PA-C  benzonatate (TESSALON) 100 MG capsule Take 1 capsule (100 mg total) by mouth 3 (three) times daily as needed for cough. 04/11/17   Everlene Farrieransie, William, PA-C  cetirizine (ZYRTEC ALLERGY) 10 MG tablet Take 1 tablet (10 mg total) by mouth daily. 04/11/17   Everlene Farrieransie, William, PA-C  cyclobenzaprine (FLEXERIL) 10 MG tablet Take 1 tablet (10 mg total) by mouth 2 (two) times daily as needed for muscle spasms. 09/10/16   Rolan BuccoBelfi, Melanie, MD  fluticasone (FLONASE) 50 MCG/ACT nasal spray Place 2 sprays into both nostrils daily. 04/11/17   Everlene Farrieransie, William, PA-C  ibuprofen (ADVIL,MOTRIN) 600 MG tablet Take 1 tablet (  600 mg total) by mouth every 6 (six) hours as needed. 09/10/16   Rolan Bucco, MD  methocarbamol (ROBAXIN) 500 MG tablet Take 1 tablet (500 mg total) by mouth 2 (two) times daily. 11/03/17   Harlene Salts A, PA-C  senna-docusate (SENOKOT-S) 8.6-50 MG tablet Start 1 tab QHS, up to 2tabs BID. Max 4 tabs daily for constipation 08/13/17   Pisciotta, Mardella Layman    Family History History reviewed. No pertinent family history.  Social History Social History   Tobacco Use  . Smoking status: Never Smoker  . Smokeless tobacco: Never Used  Substance Use Topics  . Alcohol use: No  . Drug use: No     Allergies   Patient has no known allergies.   Review of Systems Review of Systems  Constitutional: Negative for fever.  HENT: Positive for congestion,  postnasal drip, rhinorrhea, sinus pressure, sinus pain and sore throat. Negative for dental problem, drooling, ear discharge, ear pain, sneezing and trouble swallowing.   Eyes: Negative for itching.  Respiratory: Negative for cough and shortness of breath.   Cardiovascular: Negative for chest pain and leg swelling.  Gastrointestinal: Negative for abdominal pain, diarrhea, nausea and vomiting.  Genitourinary: Negative.   Musculoskeletal: Negative.  Negative for neck stiffness.  Skin: Negative for rash.  Neurological: Negative for syncope and headaches.  Hematological: Negative for adenopathy.  All other systems reviewed and are negative.    Physical Exam Updated Vital Signs BP (!) 140/97 (BP Location: Right Wrist)   Pulse 69   Temp 97.9 F (36.6 C) (Oral)   Resp 16   Ht 5\' 7"  (1.702 m)   Wt 127.3 kg   LMP 12/13/2017 (Approximate)   SpO2 98%   BMI 43.96 kg/m   Physical Exam  Constitutional: She appears well-developed and well-nourished.  HENT:  Head: Normocephalic and atraumatic.  Right Ear: Tympanic membrane, external ear and ear canal normal.  Left Ear: Tympanic membrane, external ear and ear canal normal.  Nose: Mucosal edema and rhinorrhea present. Right sinus exhibits maxillary sinus tenderness. Right sinus exhibits no frontal sinus tenderness. Left sinus exhibits maxillary sinus tenderness. Left sinus exhibits no frontal sinus tenderness.  Mouth/Throat: Uvula is midline, oropharynx is clear and moist and mucous membranes are normal. No tonsillar exudate.  The patient has normal phonation and is in control of secretions. No stridor.  Midline uvula without edema. Soft palate rises symmetrically.  Mild tonsillar erythema without edema or exudates. No PTA. Tongue protrusion is normal. No trismus. No creptius on neck palpation and patient has good dentition. No gingival erythema or fluctuance noted. Mucus membranes moist.   Eyes: Pupils are equal, round, and reactive to light.  Right eye exhibits no discharge. Left eye exhibits no discharge. No scleral icterus.  Neck: Trachea normal. Neck supple. No spinous process tenderness present. No neck rigidity. Normal range of motion present.  No nuchal rigidity or meningismus  Cardiovascular: Normal rate, regular rhythm and intact distal pulses.  No murmur heard. Pulses:      Radial pulses are 2+ on the right side, and 2+ on the left side.       Dorsalis pedis pulses are 2+ on the right side, and 2+ on the left side.       Posterior tibial pulses are 2+ on the right side, and 2+ on the left side.  No lower extremity swelling or edema. Calves symmetric in size bilaterally.  Pulmonary/Chest: Effort normal and breath sounds normal. She exhibits no tenderness.  No increased work  of breathing. No accessory muscle use. Patient is sitting upright, speaking in full sentences without difficulty   Abdominal: Soft. Bowel sounds are normal. There is no tenderness. There is no rigidity, no rebound, no guarding and no CVA tenderness.  Musculoskeletal: She exhibits no edema.  Lymphadenopathy:    She has no cervical adenopathy.  Neurological: She is alert.  Skin: Skin is warm and dry. Capillary refill takes less than 2 seconds. No rash noted. She is not diaphoretic.  Psychiatric: She has a normal mood and affect.  Nursing note and vitals reviewed.    ED Treatments / Results  Labs (all labs ordered are listed, but only abnormal results are displayed) Labs Reviewed  GROUP A STREP BY PCR    EKG None  Radiology Dg Chest 2 View  Result Date: 01/13/2018 CLINICAL DATA:  Cough for 2 weeks EXAM: CHEST - 2 VIEW COMPARISON:  08/13/2017 FINDINGS: Normal heart size and mediastinal contours. No acute infiltrate or edema. No effusion or pneumothorax. No acute osseous findings. IMPRESSION: Negative chest Electronically Signed   By: Marnee Spring M.D.   On: 01/13/2018 11:53    Procedures Procedures (including critical care  time)  Medications Ordered in ED Medications - No data to display   Initial Impression / Assessment and Plan / ED Course  I have reviewed the triage vital signs and the nursing notes.  Pertinent labs & imaging results that were available during my care of the patient were reviewed by me and considered in my medical decision making (see chart for details).     32 y.o. female presenting with congestion, sinus pressure, sore throat, cough x2 weeks.  11:25 AM Patient educated on plan and approximate time it would take to complete workup.   Patient with evidence of bacterial sinusitis on exam.  Strep test negative.  No concerns for PTA or RPA.  No clinical concern for mastoiditis or meningitis.  Chest x-ray without evidence of pneumonia.  Lungs clear to auscultation bilaterally.  Patient vital signs reviewed and reassuring.  Will treat with Mucinex, Tessalon and Augmentin.  Recommended follow-up PCP this week.  Return precautions discussed.  Patient appears safe discharge.  Final Clinical Impressions(s) / ED Diagnoses   Final diagnoses:  Acute non-recurrent maxillary sinusitis  Cough    ED Discharge Orders         Ordered    Guaifenesin [facility] MAXIMUM STRENGTH) 1200 MG TB12  Every 12 hours PRN     01/13/18 1211    benzonatate (TESSALON) 100 MG capsule  Every 8 hours     01/13/18 1211    amoxicillin-clavulanate (AUGMENTIN) 875-125 MG tablet  Every 12 hours     01/13/18 1211           Princella Pellegrini 01/13/18 1215    Vanetta Mulders, MD 01/13/18 [phone]

## 2018-06-22 ENCOUNTER — Emergency Department (HOSPITAL_BASED_OUTPATIENT_CLINIC_OR_DEPARTMENT_OTHER)
Admission: EM | Admit: 2018-06-22 | Discharge: 2018-06-22 | Disposition: A | Discharge status: Home / Self Care | Payer: Medicaid Other | Attending: Emergency Medicine | Admitting: Emergency Medicine

## 2018-06-22 ENCOUNTER — Other Ambulatory Visit: Payer: Self-pay

## 2018-06-22 ENCOUNTER — Encounter (HOSPITAL_BASED_OUTPATIENT_CLINIC_OR_DEPARTMENT_OTHER): Payer: Self-pay | Admitting: Adult Health

## 2018-06-22 DIAGNOSIS — R05 Cough: Secondary | ICD-10-CM | POA: Diagnosis present

## 2018-06-22 DIAGNOSIS — E119 Type 2 diabetes mellitus without complications: Secondary | ICD-10-CM | POA: Insufficient documentation

## 2018-06-22 DIAGNOSIS — Z79899 Other long term (current) drug therapy: Secondary | ICD-10-CM | POA: Diagnosis not present

## 2018-06-22 DIAGNOSIS — R69 Illness, unspecified: Secondary | ICD-10-CM

## 2018-06-22 DIAGNOSIS — Z7984 Long term (current) use of oral hypoglycemic drugs: Secondary | ICD-10-CM | POA: Insufficient documentation

## 2018-06-22 DIAGNOSIS — J111 Influenza due to unidentified influenza virus with other respiratory manifestations: Secondary | ICD-10-CM | POA: Diagnosis not present

## 2018-06-22 MED ORDER — FLUTICASONE PROPIONATE 50 MCG/ACT NA SUSP: 1.0000 | Freq: Every day | NASAL | 2 refills | Status: DC

## 2018-06-22 MED ORDER — BENZONATATE 100 MG PO CAPS: 100.0000 mg | ORAL_CAPSULE | Freq: Three times a day (TID) | ORAL | 0 refills | Status: DC

## 2018-06-22 NOTE — ED Provider Notes (Signed)
[facility] EMERGENCY DEPARTMENT Provider Note   CSN: [id number] Arrival date & time: 06/22/18  1426    History   Chief Complaint Chief Complaint  Patient presents with  . Cough    HPI Joanna Neal is a 33 y.o. female who presents for evaluation of sore throat, generalized body aches, subjective fever/chills, headache, fatigue, x3 days.  Patient reports that she is also had some nasal congestion and cough.  She states that she has been taking over-the-counter Mucinex as well as TheraFlu with minimal improvement in symptoms.  Patient states that her mom was recently diagnosed with the flu and she has been in close contact with her.  She reports that she measured a temperature yesterday of 101.  Patient states she did get a flu vaccine.  Patient states that she has had some decreased appetite but denies any vomiting.  Patient does report she is a current smoker and smokes Black and mild intermittently.      The history is provided by the patient.    Past Medical History:  Diagnosis Date  . Diabetes mellitus without complication (HCC)     There are no active problems to display for this patient.   History reviewed. No pertinent surgical history.   OB History    Gravida  3   Para  3   Term      Preterm      AB      Living        SAB      TAB      Ectopic      Multiple      Live Births               Home Medications    Prior to Admission medications   Medication Sig Start Date End Date Taking? Authorizing Provider  albuterol (PROVENTIL HFA;VENTOLIN HFA) 108 (90 Base) MCG/ACT inhaler Inhale 1-2 puffs into the lungs every 6 (six) hours as needed for wheezing or shortness of breath. 04/29/16   Rise Mu, PA-C  amoxicillin-clavulanate (AUGMENTIN) 875-125 MG tablet Take 1 tablet by mouth every 12 (twelve) hours. 01/13/18   Maczis, Elmer Sow, PA-C  benzonatate (TESSALON) 100 MG capsule Take 1 capsule (100 mg total) by mouth every 8  (eight) hours. 06/22/18   Maxwell Caul, PA-C  cetirizine (ZYRTEC ALLERGY) 10 MG tablet Take 1 tablet (10 mg total) by mouth daily. 04/11/17   Everlene Farrier, PA-C  cyclobenzaprine (FLEXERIL) 10 MG tablet Take 1 tablet (10 mg total) by mouth 2 (two) times daily as needed for muscle spasms. 09/10/16   Rolan Bucco, MD  fluticasone (FLONASE) 50 MCG/ACT nasal spray Place 1 spray into both nostrils daily. 06/22/18   Maxwell Caul, PA-C  Guaifenesin (MUCINEX MAXIMUM STRENGTH) 1200 MG TB12 Take 1 tablet (1,200 mg total) by mouth every 12 (twelve) hours as needed. 01/13/18   Maczis, Elmer Sow, PA-C  ibuprofen (ADVIL,MOTRIN) 600 MG tablet Take 1 tablet (600 mg total) by mouth every 6 (six) hours as needed. 09/10/16   Rolan Bucco, MD  metFORMIN (GLUCOPHAGE) 1000 MG tablet Take 1,000 mg by mouth 2 (two) times daily with a meal.    [provider]  methocarbamol (ROBAXIN) 500 MG tablet Take 1 tablet (500 mg total) by mouth 2 (two) times daily. 11/03/17   Harlene Salts A, PA-C  omeprazole (PRILOSEC) 20 MG capsule Take 20 mg by mouth daily.    [provider]  phentermine 30 MG capsule  Take 30 mg by mouth every morning.    [provider]  senna-docusate (SENOKOT-S) 8.6-50 MG tablet Start 1 tab QHS, up to 2tabs BID. Max 4 tabs daily for constipation 08/13/17   Pisciotta, Joni Reining, PA-C  topiramate (TOPAMAX) 100 MG tablet Take 100 mg by mouth 2 (two) times daily.    [provider]    Family History History reviewed. No pertinent family history.  Social History Social History   Tobacco Use  . Smoking status: Never Smoker  . Smokeless tobacco: Never Used  Substance Use Topics  . Alcohol use: No  . Drug use: No     Allergies   Patient has no known allergies.   Review of Systems Review of Systems  Constitutional: Positive for appetite change, chills, fatigue and fever.  HENT: Positive for congestion and sore throat.   Respiratory: Positive for cough.  Negative for shortness of breath.   Cardiovascular: Negative for chest pain.  Gastrointestinal: Negative for abdominal pain, nausea and vomiting.  Genitourinary: Negative for dysuria and hematuria.  Musculoskeletal: Positive for myalgias.  Neurological: Positive for headaches.  All other systems reviewed and are negative.    Physical Exam Updated Vital Signs BP (!) 130/97 (BP Location: Right Arm)   Pulse 68   Temp 98.8 F (37.1 C) (Oral)   Resp 18   Ht 5\' 7"  (1.702 m)   Wt 130.2 kg   SpO2 100%   BMI 44.95 kg/m   Physical Exam Vitals signs and nursing note reviewed.  Constitutional:      Appearance: Normal appearance. She is well-developed.  HENT:     Head: Normocephalic and atraumatic.     Nose: Congestion present.     Mouth/Throat:     Pharynx: Posterior oropharyngeal erythema present.     Comments: Posterior oropharynx with slight erythema.  No edema, exudates.  Uvula is midline. Airway is patent, phonation is intact. Eyes:     General: Lids are normal.     Conjunctiva/sclera: Conjunctivae normal.     Pupils: Pupils are equal, round, and reactive to light.  Neck:     Musculoskeletal: Full passive range of motion without pain.  Cardiovascular:     Rate and Rhythm: Normal rate and regular rhythm.     Pulses: Normal pulses.     Heart sounds: Normal heart sounds. No murmur. No friction rub. No gallop.   Pulmonary:     Effort: Pulmonary effort is normal.     Breath sounds: Normal breath sounds.     Comments: Lungs clear to auscultation bilaterally.  Symmetric chest rise.  No wheezing, rales, rhonchi. Abdominal:     Palpations: Abdomen is soft. Abdomen is not rigid.     Tenderness: There is no abdominal tenderness. There is no guarding.  Musculoskeletal: Normal range of motion.  Skin:    General: Skin is warm and dry.     Capillary Refill: Capillary refill takes less than 2 seconds.  Neurological:     Mental Status: She is alert and oriented to person, place, and  time.     Comments: Cranial nerves III-XII intact Follows commands, Moves all extremities  5/5 strength to BUE and BLE  Sensation intact throughout all major nerve distributions Normal coordination No pronator drift. No gait abnormalities  No slurred speech. No facial droop.   Psychiatric:        Speech: Speech normal.      ED Treatments / Results  Labs (all labs ordered are listed, but only abnormal results  are displayed) Labs Reviewed - No data to display  EKG None  Radiology No results found.  Procedures Procedures (including critical care time)  Medications Ordered in ED Medications - No data to display   Initial Impression / Assessment and Plan / ED Course  I have reviewed the triage vital signs and the nursing notes.  Pertinent labs & imaging results that were available during my care of the patient were reviewed by me and considered in my medical decision making (see chart for details).        33 year old female who presents for evaluation of 3 days of fever, chills, generalized body aches, fatigue, myalgias, headache, sore throat.  Reports mom recently had flu.  Patient states no history of asthma.  She does smoke black in miles. Patient is afebrile, non-toxic appearing, sitting comfortably on examination table. Vital signs reviewed and stable.  Posterior oropharynx slightly erythematous but otherwise unremarkable.  History/physical exam not concerning for pharyngitis, peritonsillar abscess, Ludwig angina.  Lungs clear to auscultation bilaterally.  Given consolation of symptoms, suspect influenza.  I discussed with patient that she is outside of the 48-hour window for starting Tamiflu.  Encourage at home supportive care measures. At this time, patient exhibits no emergent life-threatening condition that require further evaluation in ED or admission. Patient had ample opportunity for questions and discussion. All patient's questions were answered with full  understanding. Strict return precautions discussed. Patient expresses understanding and agreement to plan.   Portions of this note were generated with Scientist, clinical (histocompatibility and immunogenetics). Dictation errors may occur despite best attempts at proofreading.   Final Clinical Impressions(s) / ED Diagnoses   Final diagnoses:  Influenza-like illness    ED Discharge Orders         Ordered    fluticasone (FLONASE) 50 MCG/ACT nasal spray  Daily     06/22/18 1627    benzonatate (TESSALON) 100 MG capsule  Every 8 hours     06/22/18 1627           Rosana Hoes 06/23/18 0133    Tegeler, Canary Brim, MD 06/23/18 1310

## 2018-06-22 NOTE — ED Notes (Addendum)
Pt c/o chest and sinus congestion with cough, sore throat and fever x 3 days. Pt denies taking pain/fever medication today. Denies N/V/D. Pt on phone during assessment. Pt states she was treated Thursday for "flu symptoms" and was given a steroid shot and another shot". Pt unsure what other medication was that was administered by injection.

## 2018-06-22 NOTE — Discharge Instructions (Signed)
You can take Tylenol or Ibuprofen as directed for pain. You can alternate Tylenol and Ibuprofen every 4 hours. If you take Tylenol at 1pm, then you can take Ibuprofen at 5pm. Then you can take Tylenol again at 9pm.   Use Flonase congestion.  Use Tessalon Perles for cough.  Make sure you drink plenty of fluids and staying hydrated.  Follow-up with your primary care doctor.  Return the emergency department for any persistent fever despite medication, difficulty breathing, vomiting or any other worsening or concerning symptoms.

## 2018-06-22 NOTE — ED Triage Notes (Signed)
+  contact with someone with flu. She reprots cough, sore throat, headache, runny nose and fatigue for the past few days.

## 2018-12-25 IMAGING — CR DG CHEST 2V
2 series · 2 of 2 positions shown · non-contrast
Comparison: April 11, 2017

CLINICAL DATA: Abdominal cramping and shortness of breath for 1
week

EXAM:
CHEST  2 VIEW

[w chest pa]
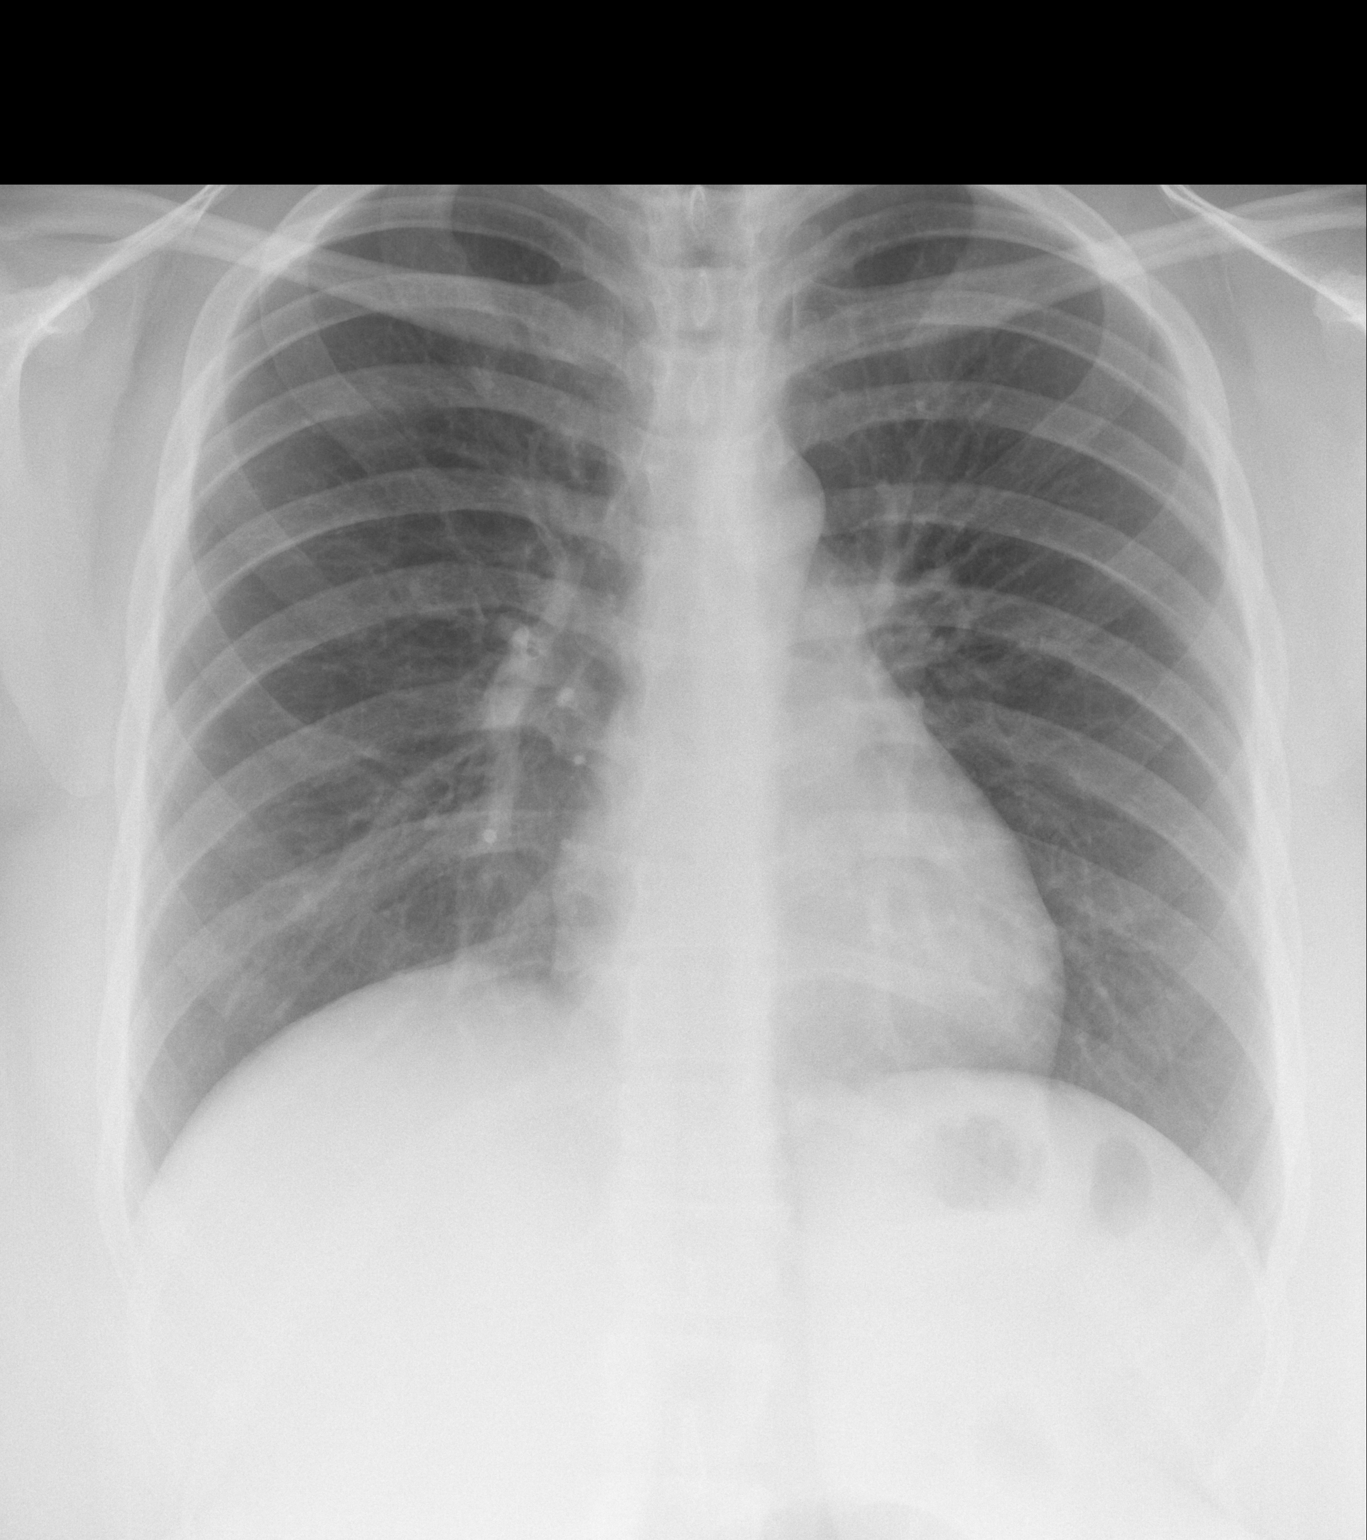

[w chest lat *]
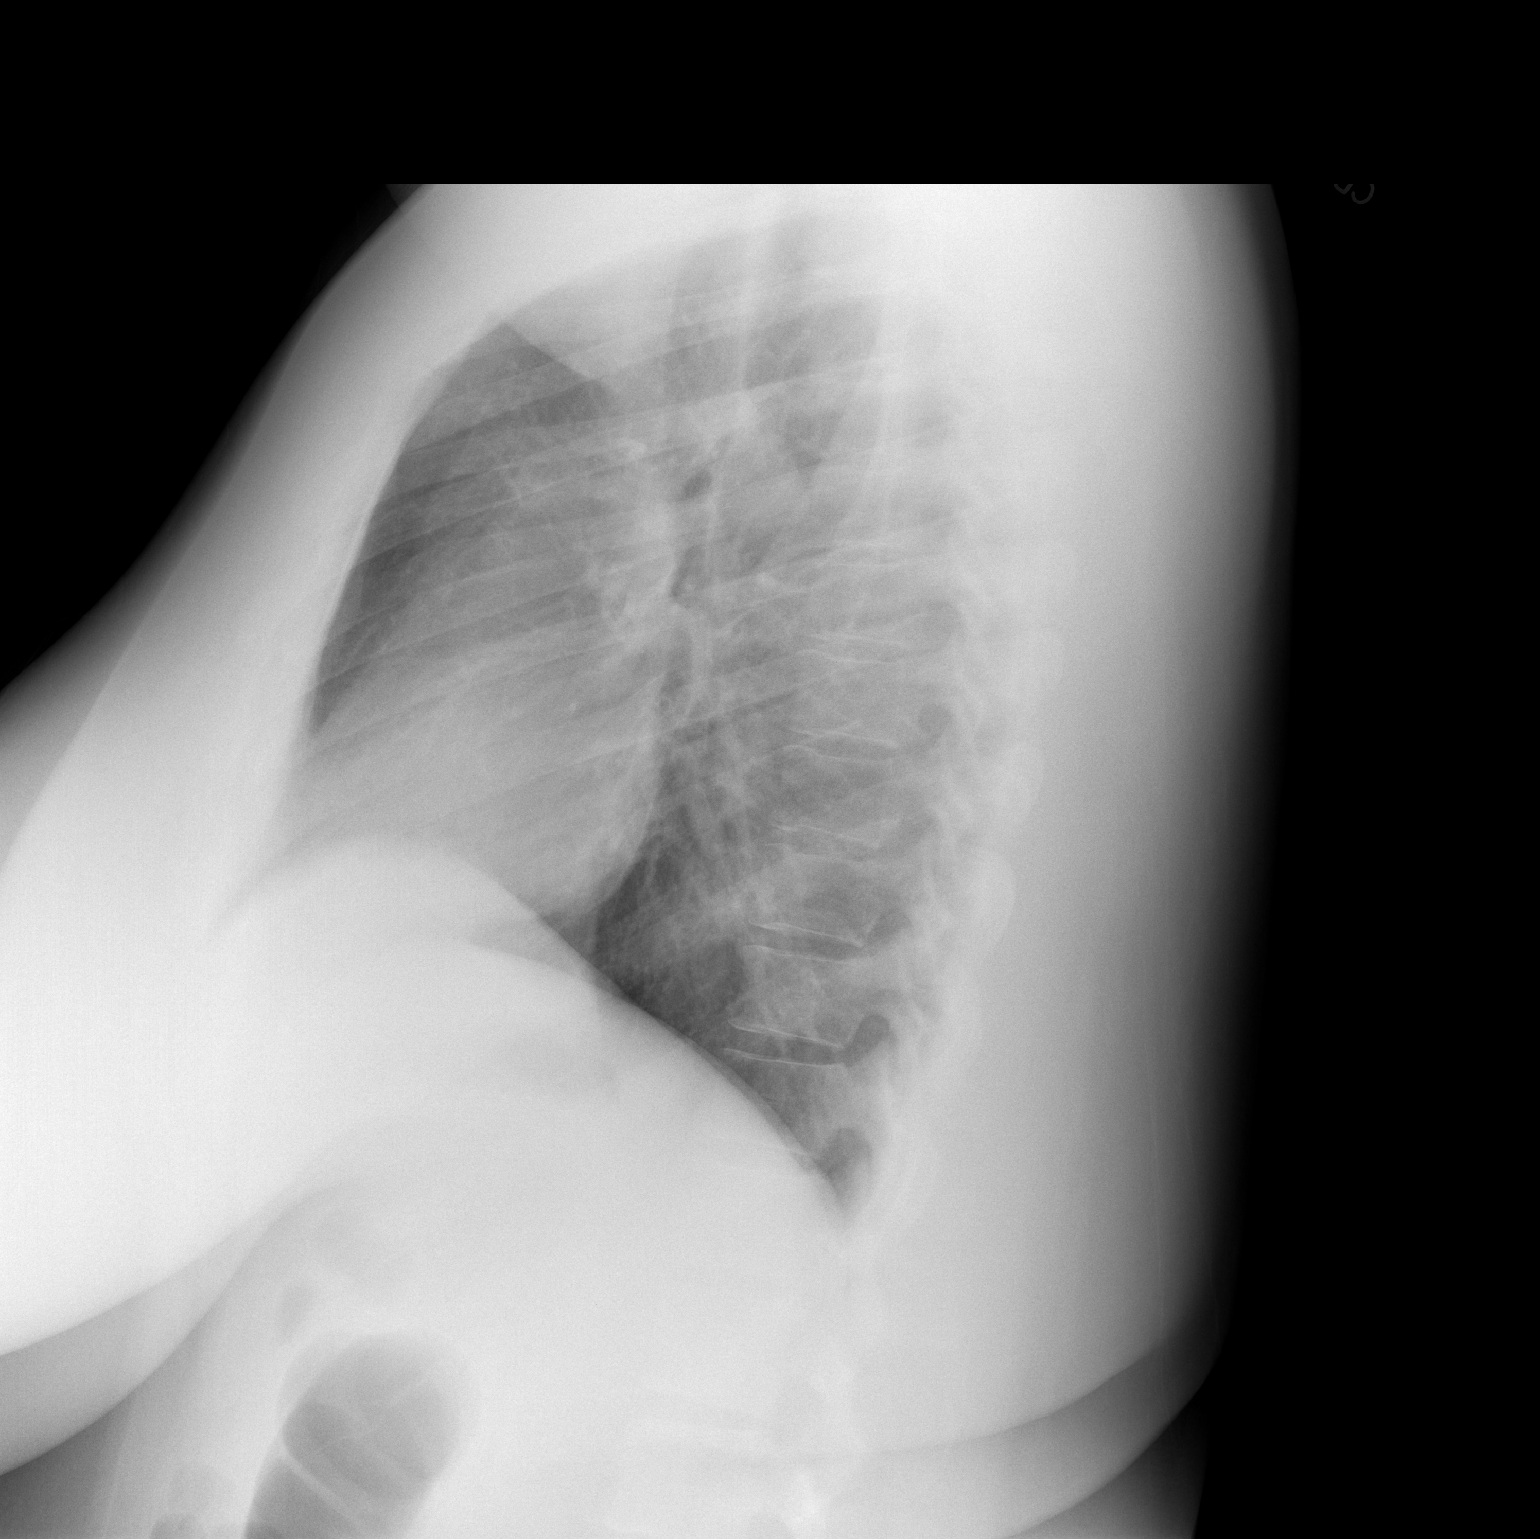

[2 of 2 positions shown; findings below may reference images not displayed]

FINDINGS: The heart size and mediastinal contours are within normal limits.
Both lungs are clear. The visualized skeletal structures are
unremarkable.
IMPRESSION: No active cardiopulmonary disease.

## 2019-03-27 IMAGING — CR DG ABDOMEN ACUTE W/ 1V CHEST
4 series · 4 of 4 positions shown · non-contrast
Comparison: Radiographs of May 13, 2017.

CLINICAL DATA: Abdominal pain, bloating and constipation for 2
weeks.

EXAM:
DG ABDOMEN ACUTE W/ 1V CHEST

[w chest pa]
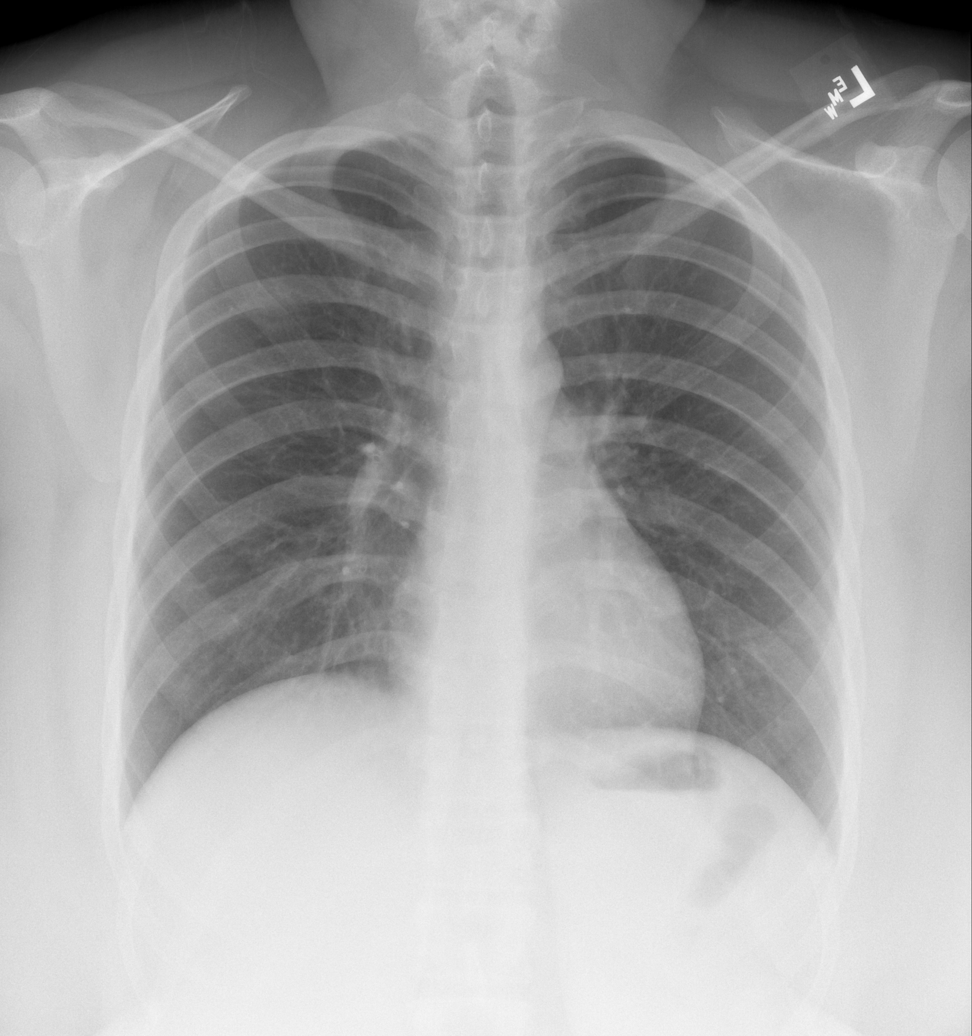

[w abdomen upright]
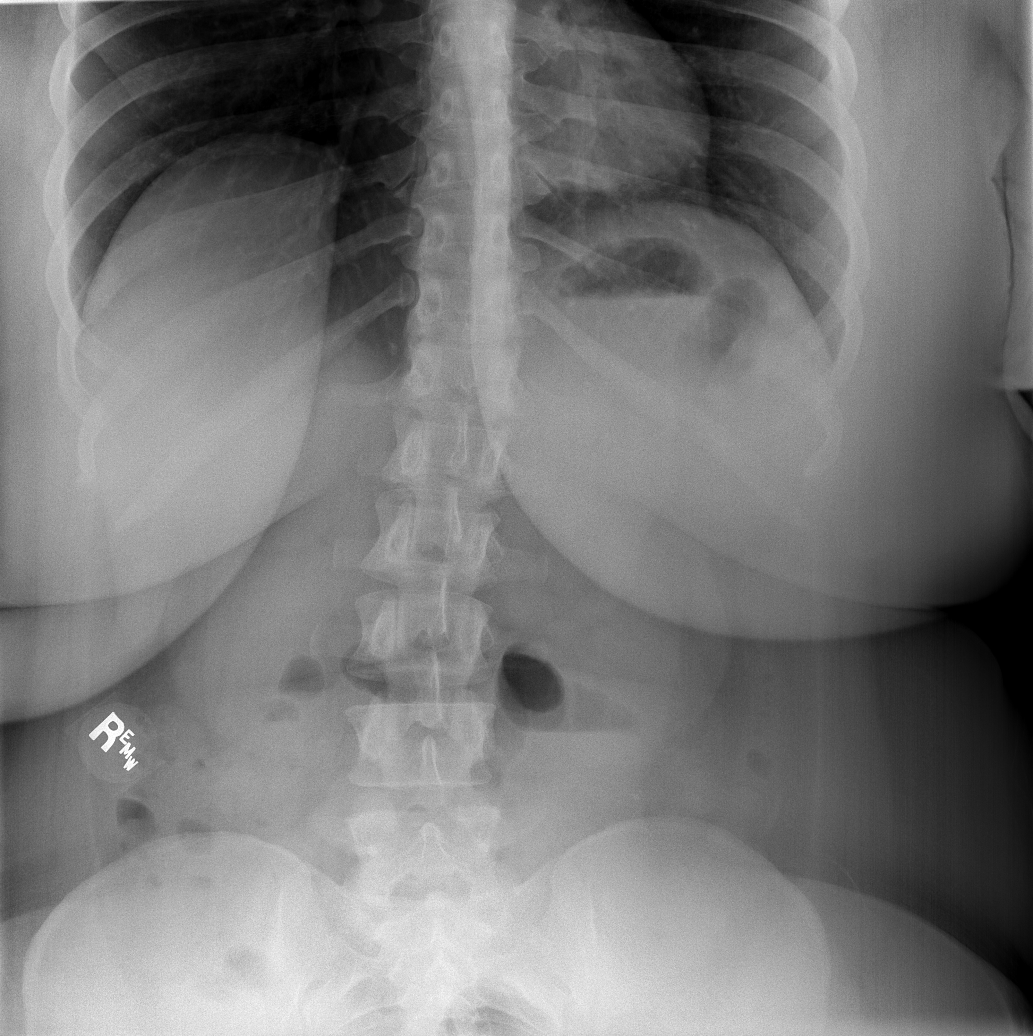

[t abdomen supine (1 of 2)]
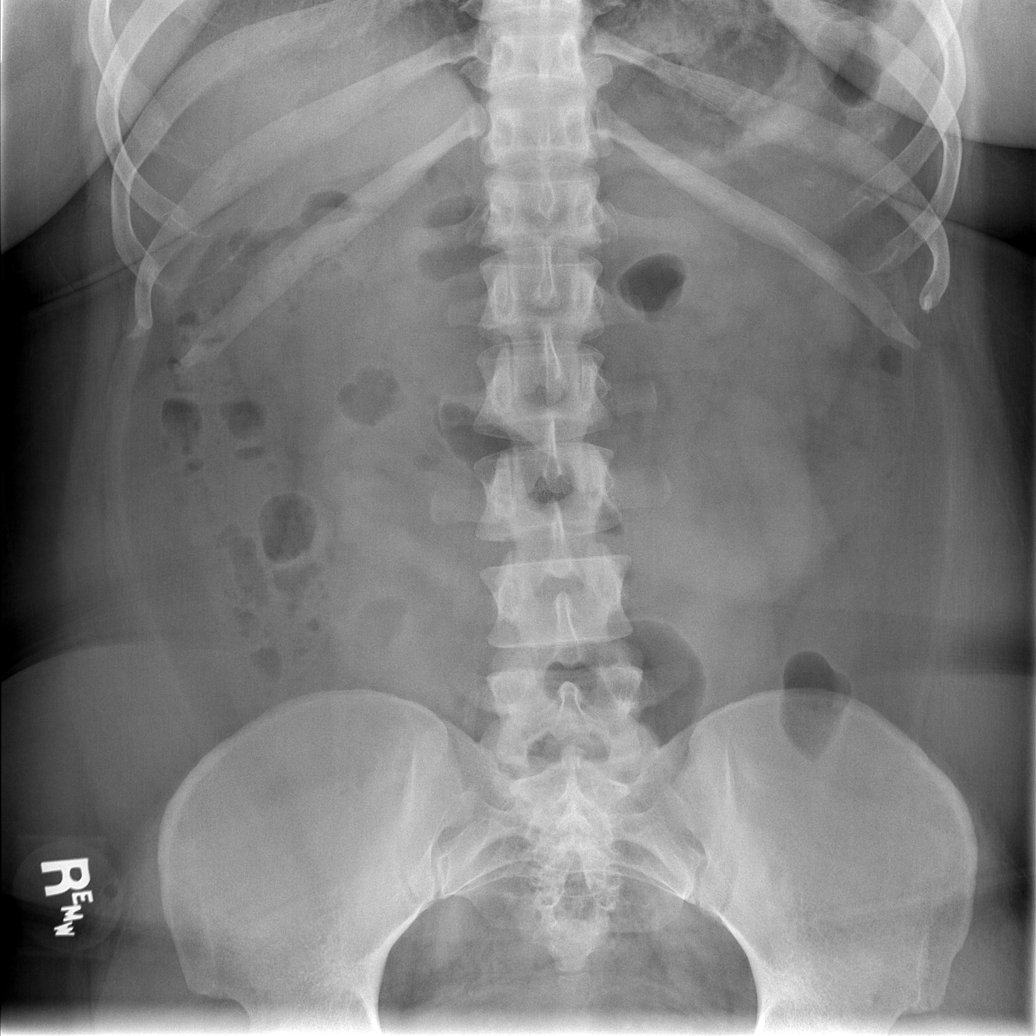

[t abdomen supine (2 of 2)]
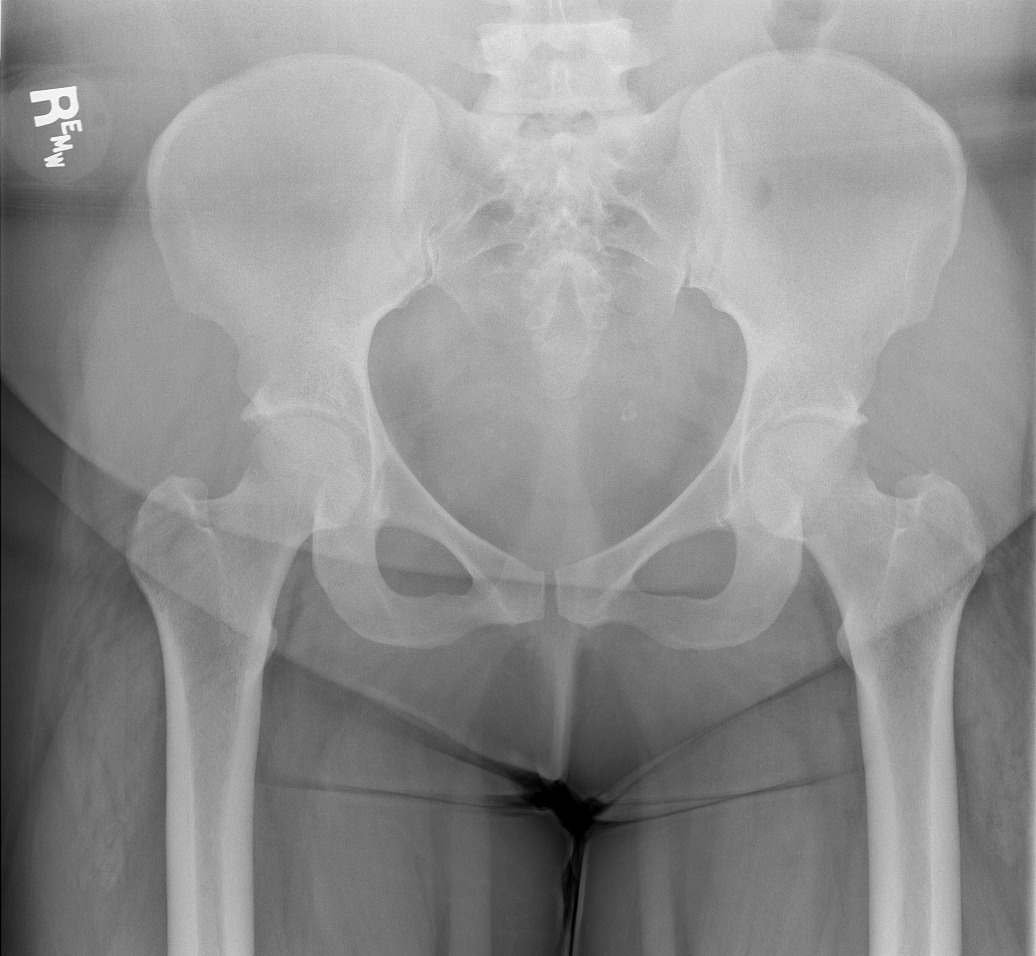

[4 of 4 positions shown; findings below may reference images not displayed]

FINDINGS: There is no evidence of dilated bowel loops or free intraperitoneal
air. No radiopaque calculi or other significant radiographic
abnormality is seen. Heart size and mediastinal contours are within
normal limits. Both lungs are clear. No significant stool burden is
noted.
IMPRESSION: No evidence of bowel obstruction or ileus. No acute cardiopulmonary
disease.

## 2019-04-09 ENCOUNTER — Encounter (HOSPITAL_BASED_OUTPATIENT_CLINIC_OR_DEPARTMENT_OTHER): Payer: Self-pay | Admitting: Emergency Medicine

## 2019-04-09 ENCOUNTER — Emergency Department (HOSPITAL_BASED_OUTPATIENT_CLINIC_OR_DEPARTMENT_OTHER): Payer: Medicaid Other

## 2019-04-09 ENCOUNTER — Other Ambulatory Visit: Payer: Self-pay

## 2019-04-09 ENCOUNTER — Emergency Department (HOSPITAL_BASED_OUTPATIENT_CLINIC_OR_DEPARTMENT_OTHER)
Admission: EM | Admit: 2019-04-09 | Discharge: 2019-04-09 | Disposition: A | Discharge status: Home / Self Care | Payer: Medicaid Other | Attending: Emergency Medicine | Admitting: Emergency Medicine

## 2019-04-09 DIAGNOSIS — U071 COVID-19: Secondary | ICD-10-CM | POA: Insufficient documentation

## 2019-04-09 DIAGNOSIS — Z7984 Long term (current) use of oral hypoglycemic drugs: Secondary | ICD-10-CM | POA: Insufficient documentation

## 2019-04-09 DIAGNOSIS — E119 Type 2 diabetes mellitus without complications: Secondary | ICD-10-CM | POA: Diagnosis not present

## 2019-04-09 DIAGNOSIS — R0602 Shortness of breath: Secondary | ICD-10-CM | POA: Diagnosis present

## 2019-04-09 DIAGNOSIS — Z79899 Other long term (current) drug therapy: Secondary | ICD-10-CM | POA: Insufficient documentation

## 2019-04-09 NOTE — ED Notes (Signed)
Pt verbalized understanding of dc instructions.

## 2019-04-09 NOTE — ED Provider Notes (Signed)
[facility] EMERGENCY DEPARTMENT Provider Note   CSN: [id number] Arrival date & time: 04/09/19  1159     History   Chief Complaint Chief Complaint  Patient presents with  . Shortness of Breath    HPI Joanna Neal is a 33 y.o. female.     Patient with history of diabetes presents to the emergency department on day 7 of her coronavirus infection.  Patient had flulike symptoms at the onset.  She continues to have a sensation of chest tightness and some shortness of breath.  This is why she came to the emergency department today.  She states that it feels difficult for her to take a deep breath.  No lower extremity swelling.  Shortness of breath is not made worse with exertion.  It occurs at rest.  No associated chest pains.  No vomiting or diarrhea.  No fever upon arrival.  She endorses loss of smell and taste and has had a decreased appetite due to this.  Also endorses headaches for which she has been taking ibuprofen without much improvement.  Patient denies risk factors for pulmonary embolism including: unilateral leg swelling, history of DVT/PE/other blood clots, use of exogenous hormones, recent immobilizations, recent surgery, recent travel (>4hr segment), malignancy, hemoptysis.        Past Medical History:  Diagnosis Date  . Diabetes mellitus without complication (HCC)     There are no active problems to display for this patient.   History reviewed. No pertinent surgical history.   OB History    Gravida  3   Para  3   Term      Preterm      AB      Living        SAB      TAB      Ectopic      Multiple      Live Births               Home Medications    Prior to Admission medications   Medication Sig Start Date End Date Taking? Authorizing Provider  benzonatate (TESSALON) 100 MG capsule Take 1 capsule (100 mg total) by mouth every 8 (eight) hours. 06/22/18   Maxwell Caul, PA-C  ibuprofen (ADVIL,MOTRIN) 600 MG tablet Take 1  tablet (600 mg total) by mouth every 6 (six) hours as needed. 09/10/16   Rolan Bucco, MD  metFORMIN (GLUCOPHAGE) 1000 MG tablet Take 1,000 mg by mouth 2 (two) times daily with a meal.    [provider]  omeprazole (PRILOSEC) 20 MG capsule Take 20 mg by mouth daily.    [provider]  phentermine 30 MG capsule Take 30 mg by mouth every morning.    [provider]  senna-docusate (SENOKOT-S) 8.6-50 MG tablet Start 1 tab QHS, up to 2tabs BID. Max 4 tabs daily for constipation 08/13/17   Pisciotta, Joni Reining, PA-C  topiramate (TOPAMAX) 100 MG tablet Take 100 mg by mouth 2 (two) times daily.    [provider]  albuterol (PROVENTIL HFA;VENTOLIN HFA) 108 (90 Base) MCG/ACT inhaler Inhale 1-2 puffs into the lungs every 6 (six) hours as needed for wheezing or shortness of breath. 04/29/16 04/09/19  Rise Mu, PA-C  cetirizine (ZYRTEC ALLERGY) 10 MG tablet Take 1 tablet (10 mg total) by mouth daily. 04/11/17 04/09/19  Everlene Farrier, PA-C  fluticasone (FLONASE) 50 MCG/ACT nasal spray Place 1 spray into both nostrils daily. 06/22/18 04/09/19  Maxwell Caul, PA-C  Family History History reviewed. No pertinent family history.  Social History Social History   Tobacco Use  . Smoking status: Never Smoker  . Smokeless tobacco: Never Used  Substance Use Topics  . Alcohol use: No  . Drug use: No     Allergies   Patient has no known allergies.   Review of Systems Review of Systems  Constitutional: Negative for fever.  HENT: Negative for rhinorrhea and sore throat.   Eyes: Negative for redness.  Respiratory: Positive for cough, chest tightness and shortness of breath.   Cardiovascular: Negative for chest pain.  Gastrointestinal: Negative for abdominal pain, diarrhea, nausea and vomiting.  Genitourinary: Negative for dysuria.  Musculoskeletal: Positive for myalgias.  Skin: Negative for rash.  Neurological: Positive for headaches.     Physical  Exam Updated Vital Signs BP (!) 152/97   Pulse 63   Temp 98.6 F (37 C) (Oral)   Resp 20   Ht 5\' 8"  (1.727 m)   Wt (!) 137 kg   LMP 04/01/2019   SpO2 98%   BMI 45.92 kg/m   Physical Exam Vitals signs and nursing note reviewed.  Constitutional:      Appearance: She is well-developed.  HENT:     Head: Normocephalic and atraumatic.  Eyes:     General:        Right eye: No discharge.        Left eye: No discharge.     Conjunctiva/sclera: Conjunctivae normal.  Neck:     Musculoskeletal: Normal range of motion and neck supple.  Cardiovascular:     Rate and Rhythm: Normal rate and regular rhythm.     Heart sounds: Normal heart sounds.  Pulmonary:     Effort: Pulmonary effort is normal.     Breath sounds: Normal breath sounds. No decreased breath sounds, wheezing, rhonchi or rales.  Abdominal:     Palpations: Abdomen is soft.     Tenderness: There is no abdominal tenderness.  Skin:    General: Skin is warm and dry.  Neurological:     Mental Status: She is alert.  Psychiatric:        Mood and Affect: Mood is anxious.      ED Treatments / Results  Labs (all labs ordered are listed, but only abnormal results are displayed) Labs Reviewed - No data to display  EKG None  Radiology No results found.  Procedures Procedures (including critical care time)  Medications Ordered in ED Medications - No data to display   Initial Impression / Assessment and Plan / ED Course  I have reviewed the triage vital signs and the nursing notes.  Pertinent labs & imaging results that were available during my care of the patient were reviewed by me and considered in my medical decision making (see chart for details).        Patient seen and examined.  Overall patient appears anxious but well.  She ambulated in room and maintained oxygen saturation of 97 to 98%.  She is at 100% on room air while sitting still.  Will obtain chest x-ray.  Patient counseled and I attempted  reassurance.  Anticipate discharge to home if chest x-ray is reassuring.  Vital signs reviewed and are as follows: BP (!) 152/97   Pulse 63   Temp 98.6 F (37 C) (Oral)   Resp 20   Ht 5\' 8"  (1.727 m)   Wt (!) 137 kg   LMP 04/01/2019   SpO2 98%   BMI 45.92 kg/m  1:46 PM chest x-ray reviewed.  No acute disease.  Will discharge to home with continued isolation and supportive treatment.  Discussed signs and symptoms to return including worsening shortness of breath, trouble breathing, increased work of breathing, persistent vomiting or fevers.  Final Clinical Impressions(s) / ED Diagnoses   Final diagnoses:  COVID-19 virus infection  SOB (shortness of breath)   Patient with shortness of breath in setting of COVID-19.  Patient is oxygenating well.  No increased work of breathing or accessory muscle use.  Patient appears somewhat anxious.  Indications for admission at this time.  Doubt ACS/PE/DVT based on exam and history, lack of pain.   ED Discharge Orders    None       Renne CriglerGeiple, Dazhane Villagomez, Cordelia Poche-C 04/09/19 1347    Raeford RazorKohut, Stephen, MD 04/10/19 [phone]

## 2019-04-09 NOTE — ED Triage Notes (Signed)
Pt tested on Sunday and was positive for covid.  Pt feels like she cannot take a deep breath easily.  No fevers at home.  Some generalized body aches.

## 2019-04-09 NOTE — Discharge Instructions (Signed)
Please read and follow all provided instructions.  Your diagnoses today include:  1. COVID-19 virus infection   2. SOB (shortness of breath)     Tests performed today include:  Chest x-ray -no serious pneumonias  Vital signs. See below for your results today.  Your vital signs look okay and you are getting plenty of oxygen.  Medications prescribed:   None  Take any prescribed medications only as directed.  Home care instructions:  Follow any educational materials contained in this packet.  BE VERY CAREFUL not to take multiple medicines containing Tylenol (also called acetaminophen). Doing so can lead to an overdose which can damage your liver and cause liver failure and possibly death.   Follow-up instructions: Please follow-up with your primary care provider in the next 3 days for further evaluation of your symptoms.   Return instructions:   Please return to the Emergency Department if you experience worsening symptoms.   Return with worsening shortness of breath, increased work of breathing, persistent vomiting, persistent high fevers.  Please return if you have any other emergent concerns.  Additional Information:  Your vital signs today were: BP (!) 152/97    Pulse 63    Temp 98.6 F (37 C) (Oral)    Resp 20    Ht 5\' 8"  (1.727 m)    Wt (!) 137 kg    LMP 04/01/2019    SpO2 98%    BMI 45.92 kg/m  If your blood pressure (BP) was elevated above 135/85 this visit, please have this repeated by your doctor within one month. --------------

## 2019-04-09 NOTE — ED Notes (Signed)
Pt walked in room on pulse ox.  Pt was 97-98% on room air

## 2019-07-20 ENCOUNTER — Other Ambulatory Visit: Payer: Self-pay

## 2019-07-20 ENCOUNTER — Emergency Department (HOSPITAL_BASED_OUTPATIENT_CLINIC_OR_DEPARTMENT_OTHER)
Admission: EM | Admit: 2019-07-20 | Discharge: 2019-07-20 | Disposition: A | Discharge status: Home / Self Care | Payer: Medicaid Other | Attending: Emergency Medicine | Admitting: Emergency Medicine

## 2019-07-20 ENCOUNTER — Encounter (HOSPITAL_BASED_OUTPATIENT_CLINIC_OR_DEPARTMENT_OTHER): Payer: Self-pay | Admitting: Emergency Medicine

## 2019-07-20 DIAGNOSIS — M791 Myalgia, unspecified site: Secondary | ICD-10-CM | POA: Insufficient documentation

## 2019-07-20 DIAGNOSIS — Y92410 Unspecified street and highway as the place of occurrence of the external cause: Secondary | ICD-10-CM | POA: Insufficient documentation

## 2019-07-20 DIAGNOSIS — Y998 Other external cause status: Secondary | ICD-10-CM | POA: Diagnosis not present

## 2019-07-20 DIAGNOSIS — E119 Type 2 diabetes mellitus without complications: Secondary | ICD-10-CM | POA: Diagnosis not present

## 2019-07-20 DIAGNOSIS — F17290 Nicotine dependence, other tobacco product, uncomplicated: Secondary | ICD-10-CM | POA: Insufficient documentation

## 2019-07-20 DIAGNOSIS — Z7984 Long term (current) use of oral hypoglycemic drugs: Secondary | ICD-10-CM | POA: Diagnosis not present

## 2019-07-20 DIAGNOSIS — Y9389 Activity, other specified: Secondary | ICD-10-CM | POA: Insufficient documentation

## 2019-07-20 DIAGNOSIS — Z79899 Other long term (current) drug therapy: Secondary | ICD-10-CM | POA: Insufficient documentation

## 2019-07-20 DIAGNOSIS — M545 Low back pain, unspecified: Secondary | ICD-10-CM

## 2019-07-20 MED ORDER — NAPROXEN 500 MG PO TABS: 500.0000 mg | ORAL_TABLET | Freq: Two times a day (BID) | ORAL | 0 refills | Status: DC

## 2019-07-20 MED ORDER — IBUPROFEN 800 MG PO TABS
800.0000 mg | ORAL_TABLET | Freq: Once | ORAL | Status: AC
  Administered 2019-07-20: 800 mg via ORAL
  Filled 2019-07-20: qty 1

## 2019-07-20 MED ORDER — METHOCARBAMOL 500 MG PO TABS: 500.0000 mg | ORAL_TABLET | Freq: Two times a day (BID) | ORAL | 0 refills | Status: DC
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
## 2023-02-16 ENCOUNTER — Ambulatory Visit (INDEPENDENT_AMBULATORY_CARE_PROVIDER_SITE_OTHER): Payer: Medicaid Other

## 2023-02-16 VITALS — BP 123/87 | HR 66 | Wt 257.8 lb

## 2023-02-16 DIAGNOSIS — Z3201 Encounter for pregnancy test, result positive: Secondary | ICD-10-CM

## 2023-02-16 DIAGNOSIS — Z3200 Encounter for pregnancy test, result unknown: Secondary | ICD-10-CM

## 2023-02-16 LAB — POCT URINE PREGNANCY: Preg Test, Ur: POSITIVE — AB

## 2023-02-16 MED ORDER — PRENATE MINI 18-0.6-0.4-350 MG PO CAPS: 1.0000 | ORAL_CAPSULE | Freq: Every day | ORAL | 11 refills | Status: AC

## 2023-02-16 NOTE — Progress Notes (Signed)
..  Joanna Neal presents today for UPT. She has no unusual complaints. LMP: 01/22/23     OBJECTIVE: Appears well, in no apparent distress.  OB History     Gravida  4   Para  3   Term  3   Preterm      AB      Living  3      SAB      IAB      Ectopic      Multiple      Live Births  3          Home UPT Result: Positive In-Office UPT result: Positive I have reviewed the patient's medical, obstetrical, social, and family histories, and medications.   ASSESSMENT: Positive pregnancy test  PLAN Prenatal care to be completed at: [facility] Sent PNV to pharmacy Provided safe med list

## 2023-02-28 ENCOUNTER — Other Ambulatory Visit: Payer: Self-pay

## 2023-02-28 DIAGNOSIS — O219 Vomiting of pregnancy, unspecified: Secondary | ICD-10-CM

## 2023-02-28 MED ORDER — PROMETHAZINE HCL 25 MG PO TABS: 25.0000 mg | ORAL_TABLET | Freq: Four times a day (QID) | ORAL | 2 refills | Status: DC | PRN

## 2023-03-23 ENCOUNTER — Ambulatory Visit (INDEPENDENT_AMBULATORY_CARE_PROVIDER_SITE_OTHER): Payer: Medicaid Other | Admitting: *Deleted

## 2023-03-23 ENCOUNTER — Other Ambulatory Visit (HOSPITAL_COMMUNITY)
Admission: RE | Admit: 2023-03-23 | Discharge: 2023-03-23 | Disposition: A | Discharge status: Home / Self Care | Payer: Medicaid Other | Source: Ambulatory Visit | Attending: Obstetrics and Gynecology | Admitting: Obstetrics and Gynecology

## 2023-03-23 ENCOUNTER — Other Ambulatory Visit (INDEPENDENT_AMBULATORY_CARE_PROVIDER_SITE_OTHER): Payer: Medicaid Other

## 2023-03-23 VITALS — BP 124/84 | HR 67 | Wt 265.6 lb

## 2023-03-23 DIAGNOSIS — Z1339 Encounter for screening examination for other mental health and behavioral disorders: Secondary | ICD-10-CM

## 2023-03-23 DIAGNOSIS — Z3481 Encounter for supervision of other normal pregnancy, first trimester: Secondary | ICD-10-CM | POA: Diagnosis not present

## 2023-03-23 DIAGNOSIS — O3680X0 Pregnancy with inconclusive fetal viability, not applicable or unspecified: Secondary | ICD-10-CM

## 2023-03-23 DIAGNOSIS — Z3A08 8 weeks gestation of pregnancy: Secondary | ICD-10-CM

## 2023-03-23 DIAGNOSIS — Z3480 Encounter for supervision of other normal pregnancy, unspecified trimester: Secondary | ICD-10-CM

## 2023-03-23 DIAGNOSIS — O0992 Supervision of high risk pregnancy, unspecified, second trimester: Secondary | ICD-10-CM | POA: Insufficient documentation

## 2023-03-23 DIAGNOSIS — K219 Gastro-esophageal reflux disease without esophagitis: Secondary | ICD-10-CM | POA: Insufficient documentation

## 2023-03-23 MED ORDER — PROMETHAZINE HCL 25 MG PO TABS: 25.0000 mg | ORAL_TABLET | Freq: Four times a day (QID) | ORAL | 1 refills | Status: DC | PRN

## 2023-03-23 MED ORDER — BLOOD PRESSURE KIT DEVI: 1.0000 | 0 refills | Status: DC

## 2023-03-23 MED ORDER — DOXYLAMINE-PYRIDOXINE 10-10 MG PO TBEC: 2.0000 | DELAYED_RELEASE_TABLET | Freq: Every day | ORAL | 5 refills | Status: DC

## 2023-03-23 NOTE — Patient Instructions (Signed)

## 2023-03-23 NOTE — Progress Notes (Signed)
New OB Intake  I connected with Joanna Neal  on 03/23/23 at  8:15 AM EST by In Person Visit and verified that I am speaking with the correct person using two identifiers. Nurse is located at [facility] and pt is located at [place].  I discussed the limitations, risks, security and privacy concerns of performing an evaluation and management service by telephone and the availability of in person appointments. I also discussed with the patient that there may be a patient responsible charge related to this service. The patient expressed understanding and agreed to proceed.  I explained I am completing New OB Intake today. We discussed EDD of 10/29/2023, by Last Menstrual Period. Pt is [id number]. I reviewed her allergies, medications and Medical/Surgical/OB history.    Patient Active Problem List   Diagnosis Date Noted   GERD (gastroesophageal reflux disease) 03/23/2023   Bipolar affective disorder, mixed, severe (HCC) 10/05/2012   Depressive disorder 10/05/2012    Concerns addressed today  Delivery Plans Plans to deliver at [facility]. Discussed the nature of our practice with multiple providers including residents and students. Due to the size of the practice, the delivering provider may not be the same as those providing prenatal care.   Patient is not interested in water birth. Offered upcoming OB visit with CNM to discuss further.  MyChart/Babyscripts MyChart access verified. I explained pt will have some visits in office and some virtually. Babyscripts instructions given and order placed. Patient verifies receipt of registration text/e-mail. Account successfully created and app downloaded.  Blood Pressure Cuff/Weight Scale Blood pressure cuff ordered for patient to pick-up from [employer]. Explained after first prenatal appt pt will check weekly and document in Babyscripts. Patient does not have weight scale; patient may purchase if they desire to track weight weekly in  Babyscripts.  Anatomy US Explained first scheduled [place] will be around 19 weeks. Anatomy US scheduled for TBD at TBD.  Interested in [place]? If yes, send referral and doula dot phrase.   Is patient a candidate for Babyscripts Optimization? No - High Risk  First visit review I reviewed new OB appt with patient. Explained pt will be seen by Dr. Jolayne Panther at first visit. Discussed Avelina Laine genetic screening with patient. Requests Panorama and Horizon.. Routine prenatal labs  collected at today's visit.    Last Pap No results found for: "DIAGPAP"  Harrel Lemon, RN 03/23/2023  8:24 AM

## 2023-03-24 ENCOUNTER — Encounter: Payer: Self-pay | Admitting: Obstetrics and Gynecology

## 2023-03-24 DIAGNOSIS — Z2839 Other underimmunization status: Secondary | ICD-10-CM | POA: Insufficient documentation

## 2023-03-24 DIAGNOSIS — O09899 Supervision of other high risk pregnancies, unspecified trimester: Secondary | ICD-10-CM | POA: Insufficient documentation

## 2023-03-24 LAB — CBC/D/PLT+RPR+RH+ABO+RUBIGG...
Antibody Screen: NEGATIVE
Basophils Absolute: 0.1 10*3/uL (ref 0.0–0.2)
Basos: 1 %
EOS (ABSOLUTE): 0.1 10*3/uL (ref 0.0–0.4)
Eos: 1 %
HCV Ab: NONREACTIVE
HIV Screen 4th Generation wRfx: NONREACTIVE
Hematocrit: 40.5 % (ref 34.0–46.6)
Hemoglobin: 12.8 g/dL (ref 11.1–15.9)
Hepatitis B Surface Ag: NEGATIVE
Immature Grans (Abs): 0 10*3/uL (ref 0.0–0.1)
Immature Granulocytes: 0 %
Lymphocytes Absolute: 1.7 10*3/uL (ref 0.7–3.1)
Lymphs: 20 %
MCH: 29.8 pg (ref 26.6–33.0)
MCHC: 31.6 g/dL (ref 31.5–35.7)
MCV: 94 fL (ref 79–97)
Monocytes Absolute: 0.5 10*3/uL (ref 0.1–0.9)
Monocytes: 5 %
Neutrophils Absolute: 6.2 10*3/uL (ref 1.4–7.0)
Neutrophils: 73 %
Platelets: 264 10*3/uL (ref 150–450)
RBC: 4.29 x10E6/uL (ref 3.77–5.28)
RDW: 13.2 % (ref 11.7–15.4)
RPR Ser Ql: NONREACTIVE
Rh Factor: NEGATIVE
Rubella Antibodies, IGG: <0.9 {index} — ABNORMAL LOW (ref >0.99)
WBC: 8.6 10*3/uL (ref 3.4–10.8)

## 2023-03-24 LAB — COMPREHENSIVE METABOLIC PANEL
ALT: 17 [IU]/L (ref 0–32)
AST: 15 [IU]/L (ref 0–40)
Albumin: 3.9 g/dL (ref 3.9–4.9)
Alkaline Phosphatase: 61 [IU]/L (ref 44–121)
BUN/Creatinine Ratio: 8 — ABNORMAL LOW (ref 9–23)
BUN: 6 mg/dL (ref 6–20)
Bilirubin Total: 0.5 mg/dL (ref 0.0–1.2)
CO2: 22 mmol/L (ref 20–29)
Calcium: 9.2 mg/dL (ref 8.7–10.2)
Chloride: 100 mmol/L (ref 96–106)
Creatinine, Ser: 0.72 mg/dL (ref 0.57–1.00)
Globulin, Total: 2.6 g/dL (ref 1.5–4.5)
Glucose: 85 mg/dL (ref 70–99)
Potassium: 4.4 mmol/L (ref 3.5–5.2)
Sodium: 136 mmol/L (ref 134–144)
Total Protein: 6.5 g/dL (ref 6.0–8.5)
eGFR: 112 mL/min/{1.73_m2} (ref >59)

## 2023-03-24 LAB — HEMOGLOBIN A1C
Est. average glucose Bld gHb Est-mCnc: 111 mg/dL
Hgb A1c MFr Bld: 5.5 % (ref 4.8–5.6)

## 2023-03-24 LAB — HCV INTERPRETATION

## 2023-03-26 LAB — URINE CULTURE, OB REFLEX

## 2023-03-26 LAB — CERVICOVAGINAL ANCILLARY ONLY
Bacterial Vaginitis (gardnerella): POSITIVE — AB
Chlamydia: NEGATIVE
Comment: NEGATIVE
Comment: NEGATIVE
Comment: NORMAL
Neisseria Gonorrhea: NEGATIVE

## 2023-03-26 LAB — CULTURE, OB URINE

## 2023-03-27 MED ORDER — METRONIDAZOLE 500 MG PO TABS: 500.0000 mg | ORAL_TABLET | Freq: Two times a day (BID) | ORAL | 0 refills | Status: AC

## 2023-03-27 NOTE — Addendum Note (Signed)
Addended by: Harvie Bridge on: 03/27/2023 03:14 PM   Modules accepted: Orders

## 2023-04-06 ENCOUNTER — Ambulatory Visit (INDEPENDENT_AMBULATORY_CARE_PROVIDER_SITE_OTHER): Payer: Medicaid Other | Admitting: Obstetrics and Gynecology

## 2023-04-06 ENCOUNTER — Other Ambulatory Visit (HOSPITAL_COMMUNITY)
Admission: RE | Admit: 2023-04-06 | Discharge: 2023-04-06 | Disposition: A | Discharge status: Home / Self Care | Payer: Medicaid Other | Source: Ambulatory Visit | Attending: Obstetrics and Gynecology | Admitting: Obstetrics and Gynecology

## 2023-04-06 ENCOUNTER — Encounter: Payer: Self-pay | Admitting: Obstetrics and Gynecology

## 2023-04-06 VITALS — BP 109/71 | HR 86 | Wt 271.0 lb

## 2023-04-06 DIAGNOSIS — O24919 Unspecified diabetes mellitus in pregnancy, unspecified trimester: Secondary | ICD-10-CM | POA: Insufficient documentation

## 2023-04-06 DIAGNOSIS — Z3A10 10 weeks gestation of pregnancy: Secondary | ICD-10-CM | POA: Insufficient documentation

## 2023-04-06 DIAGNOSIS — Z2839 Other underimmunization status: Secondary | ICD-10-CM

## 2023-04-06 DIAGNOSIS — F3163 Bipolar disorder, current episode mixed, severe, without psychotic features: Secondary | ICD-10-CM

## 2023-04-06 DIAGNOSIS — Z124 Encounter for screening for malignant neoplasm of cervix: Secondary | ICD-10-CM | POA: Insufficient documentation

## 2023-04-06 DIAGNOSIS — Z8639 Personal history of other endocrine, nutritional and metabolic disease: Secondary | ICD-10-CM | POA: Insufficient documentation

## 2023-04-06 DIAGNOSIS — O09529 Supervision of elderly multigravida, unspecified trimester: Secondary | ICD-10-CM | POA: Insufficient documentation

## 2023-04-06 DIAGNOSIS — O99210 Obesity complicating pregnancy, unspecified trimester: Secondary | ICD-10-CM

## 2023-04-06 DIAGNOSIS — O09521 Supervision of elderly multigravida, first trimester: Secondary | ICD-10-CM

## 2023-04-06 DIAGNOSIS — Z3480 Encounter for supervision of other normal pregnancy, unspecified trimester: Secondary | ICD-10-CM | POA: Diagnosis present

## 2023-04-06 DIAGNOSIS — O99341 Other mental disorders complicating pregnancy, first trimester: Secondary | ICD-10-CM

## 2023-04-06 DIAGNOSIS — O24911 Unspecified diabetes mellitus in pregnancy, first trimester: Secondary | ICD-10-CM

## 2023-04-06 DIAGNOSIS — O09891 Supervision of other high risk pregnancies, first trimester: Secondary | ICD-10-CM | POA: Diagnosis not present

## 2023-04-06 DIAGNOSIS — R7309 Other abnormal glucose: Secondary | ICD-10-CM | POA: Insufficient documentation

## 2023-04-06 DIAGNOSIS — Z6841 Body Mass Index (BMI) 40.0 and over, adult: Secondary | ICD-10-CM | POA: Insufficient documentation

## 2023-04-06 MED ORDER — ASPIRIN 81 MG PO TBEC: 81.0000 mg | DELAYED_RELEASE_TABLET | Freq: Every day | ORAL | 12 refills | Status: DC

## 2023-04-06 NOTE — Patient Instructions (Signed)
 First Trimester of Pregnancy  The first trimester of pregnancy starts on the first day of your last menstrual period until the end of week 12. This is months 1 through 3 of pregnancy. A week after a sperm fertilizes an egg, the egg will implant into the wall of the uterus and begin to develop into a baby. By the end of 12 weeks, all the baby's organs will be formed and the baby will be 2-3 inches in size. Body changes during your first trimester Your body goes through many changes during pregnancy. The changes vary and generally return to normal after your baby is born. Physical changes You may gain or lose weight. Your breasts may begin to grow larger and become tender. The tissue that surrounds your nipples (areola) may become darker. Dark spots or blotches (chloasma or mask of pregnancy) may develop on your face. You may have changes in your hair. These can include thickening or thinning of your hair or changes in texture. Health changes You may feel nauseous, and you may vomit. You may have heartburn. You may develop headaches. You may develop constipation. Your gums may bleed and may be sensitive to brushing and flossing. Other changes You may tire easily. You may urinate more often. Your menstrual periods will stop. You may have a loss of appetite. You may develop cravings for certain kinds of food. You may have changes in your emotions from day to day. You may have more vivid and strange dreams. Follow these instructions at home: Medicines Follow your health care provider's instructions regarding medicine use. Specific medicines may be either safe or unsafe to take during pregnancy. Do not take any medicines unless told to by your health care provider. Take a prenatal vitamin that contains at least 600 micrograms (mcg) of folic acid. Eating and drinking Eat a healthy diet that includes fresh fruits and vegetables, whole grains, good sources of protein such as meat, eggs, or tofu,  and low-fat dairy products. Avoid raw meat and unpasteurized juice, milk, and cheese. These carry germs that can harm you and your baby. If you feel nauseous or you vomit: Eat 4 or 5 small meals a day instead of 3 large meals. Try eating a few soda crackers. Drink liquids between meals instead of during meals. You may need to take these actions to prevent or treat constipation: Drink enough fluid to keep your urine pale yellow. Eat foods that are high in fiber, such as beans, whole grains, and fresh fruits and vegetables. Limit foods that are high in fat and processed sugars, such as fried or sweet foods. Activity Exercise only as directed by your health care provider. Most people can continue their usual exercise routine during pregnancy. Try to exercise for 30 minutes at least 5 days a week. Stop exercising if you develop pain or cramping in the lower abdomen or lower back. Avoid exercising if it is very hot or humid or if you are at high altitude. Avoid heavy lifting. If you choose to, you may have sex unless your health care provider tells you not to. Relieving pain and discomfort Wear a good support bra to relieve breast tenderness. Rest with your legs elevated if you have leg cramps or low back pain. If you develop bulging veins (varicose veins) in your legs: Wear support hose as told by your health care provider. Elevate your feet for 15 minutes, 3-4 times a day. Limit salt in your diet. Safety Wear your seat belt at all times when  driving or riding in a car. Talk with your health care provider if someone is verbally or physically abusive to you. Talk with your health care provider if you are feeling sad or have thoughts of hurting yourself. Lifestyle Do not use hot tubs, steam rooms, or saunas. Do not douche. Do not use tampons or scented sanitary pads. Do not use herbal remedies, alcohol, illegal drugs, or medicines that are not approved by your health care provider. Chemicals  in these products can harm your baby. Do not use any products that contain nicotine or tobacco, such as cigarettes, e-cigarettes, and chewing tobacco. If you need help quitting, ask your health care provider. Avoid cat litter boxes and soil used by cats. These carry germs that can cause birth defects in the baby and possibly loss of the unborn baby (fetus) by miscarriage or stillbirth. General instructions During routine prenatal visits in the first trimester, your health care provider will do a physical exam, perform necessary tests, and ask you how things are going. Keep all follow-up visits. This is important. Ask for help if you have counseling or nutritional needs during pregnancy. Your health care provider can offer advice or refer you to specialists for help with various needs. Schedule a dentist appointment. At home, brush your teeth with a soft toothbrush. Floss gently. Write down your questions. Take them to your prenatal visits. Where to find more information American Pregnancy Association: americanpregnancy.org [employer]ians [employer] Gynecologists: https://www.todd-brady.net/ Office on [employer] Health: MightyReward.co.nz Contact a health care provider if you have: Dizziness. A fever. Mild pelvic cramps, pelvic pressure, or nagging pain in the abdominal area. Nausea, vomiting, or diarrhea that lasts for 24 hours or longer. A bad-smelling vaginal discharge. Pain when you urinate. Known exposure to a contagious illness, such as chickenpox, measles, Zika virus, HIV, or hepatitis. Get help right away if you have: Spotting or bleeding from your vagina. Severe abdominal cramping or pain. Shortness of breath or chest pain. Any kind of trauma, such as from a fall or a car crash. New or increased pain, swelling, or redness in an arm or leg. Summary The first trimester of pregnancy starts on the first day of your last menstrual period until the end of week  12 (months 1 through 3). Eating 4 or 5 small meals a day rather than 3 large meals may help to relieve nausea and vomiting. Do not use any products that contain nicotine or tobacco, such as cigarettes, e-cigarettes, and chewing tobacco. If you need help quitting, ask your health care provider. Keep all follow-up visits. This is important. This information is not intended to replace advice given to you by your health care provider. Make sure you discuss any questions you have with your health care provider. Document Revised: 09/24/2019 Document Reviewed: 07/31/2019 Elsevier Patient Education  2024 [employer].

## 2023-04-06 NOTE — Addendum Note (Signed)
Addended by: Catalina Antigua on: 04/06/2023 08:53 AM   Modules accepted: Orders

## 2023-04-06 NOTE — Progress Notes (Signed)
NOB, c/o abdominal pain, NV.

## 2023-04-06 NOTE — Progress Notes (Addendum)
  Subjective:    Joanna Neal is a [id number] [redacted]w[redacted]d being seen today for her first obstetrical visit.  Her obstetrical history is significant for advanced maternal age and obesity. Patient does intend to breast feed. Pregnancy history fully reviewed. Patient reports a history of diabetes previously on metformin. Metformin was discontinued 3-4 years ago. Never had diabetes in her pregnancies  Patient reports nausea and vomiting which is improving with medication  Vitals:   04/06/23 0816  BP: 109/71  Pulse: 86  Weight: 271 lb (122.9 kg)    HISTORY: OB History  Gravida Para Term Preterm AB Living  4 3 3  0 0 3  SAB IAB Ectopic Multiple Live Births  0 0 0 0 3    # Outcome Date GA Lbr Len/2nd Weight Sex Type Anes PTL Lv  4 Current           3 Term 12/16/11     Vag-Spont   LIV  2 Term 02/07/09     Vag-Spont   LIV  1 Term 01/29/06     Vag-Spont   LIV   Past Medical History:  Diagnosis Date   Diabetes mellitus without complication (HCC)    Past Surgical History:  Procedure Laterality Date   NO PAST SURGERIES     No family history on file.   Exam    Uterus:   10-weeks  Pelvic Exam:    Perineum: No Hemorrhoids, Normal Perineum   Vulva: normal   Vagina:  normal mucosa, normal discharge   pH:    Cervix: multiparous appearance and closed and long   Adnexa: normal adnexa   Bony Pelvis: gynecoid  System: Breast:  normal appearance, no masses or tenderness   Skin: normal coloration and turgor, no rashes    Neurologic: oriented, no focal deficits   Extremities: normal strength, tone, and muscle mass   HEENT extra ocular movement intact   Mouth/Teeth mucous membranes moist, pharynx normal without lesions and dental hygiene good   Neck supple   Cardiovascular: regular rate and rhythm   Respiratory:  appears well, vitals normal, no respiratory distress, acyanotic, normal RR, chest clear, no wheezing, crepitations, rhonchi, normal symmetric air entry   Abdomen: soft, non-tender;  bowel sounds normal; no masses,  no organomegaly   Urinary:       Assessment:    Pregnancy: [id number] Patient Active Problem List   Diagnosis Date Noted   Diabetes mellitus affecting pregnancy, antepartum 04/06/2023   Rubella non-immune status, antepartum 03/24/2023   GERD (gastroesophageal reflux disease) 03/23/2023   Supervision of other normal pregnancy, antepartum 03/23/2023   Bipolar affective disorder, mixed, severe (HCC) 10/05/2012   Depressive disorder 10/05/2012        Plan:     Initial labs drawn. Prenatal vitamins. Problem list reviewed and updated. Genetic Screening discussed : Panorama ordered.  Ultrasound discussed; fetal survey: Scheduled. Rx ASA provided with instructions to start on 12/16 at [redacted] weeks gestations Due to pre-existing diabetes fetal echo and ophthalmology. Will defer referral to DM educator due to normal A1C for now Patient interested in Doula services- referral placed  Follow up in 4 weeks. 50% of 30 min visit spent on counseling and coordination of care.     Nishtha Raider 04/06/2023

## 2023-04-07 LAB — PROTEIN / CREATININE RATIO, URINE
Creatinine, Urine: 220.3 mg/dL
Protein, Ur: 18.1 mg/dL
Protein/Creat Ratio: 82 mg/g{creat} (ref 0–200)

## 2023-04-09 ENCOUNTER — Other Ambulatory Visit (HOSPITAL_COMMUNITY)
Admission: RE | Admit: 2023-04-09 | Discharge: 2023-04-09 | Disposition: A | Discharge status: Home / Self Care | Payer: Medicaid Other | Source: Ambulatory Visit | Attending: Obstetrics and Gynecology | Admitting: Obstetrics and Gynecology

## 2023-04-09 DIAGNOSIS — Z3A10 10 weeks gestation of pregnancy: Secondary | ICD-10-CM | POA: Insufficient documentation

## 2023-04-09 DIAGNOSIS — Z3480 Encounter for supervision of other normal pregnancy, unspecified trimester: Secondary | ICD-10-CM | POA: Insufficient documentation

## 2023-04-09 LAB — CYTOLOGY - PAP
Comment: NEGATIVE
Diagnosis: NEGATIVE
High risk HPV: NEGATIVE

## 2023-04-09 NOTE — Addendum Note (Signed)
Addended by: Maretta Bees on: 04/09/2023 09:04 AM   Modules accepted: Orders

## 2023-04-10 LAB — CERVICOVAGINAL ANCILLARY ONLY
Bacterial Vaginitis (gardnerella): POSITIVE — AB
Candida Glabrata: NEGATIVE
Candida Vaginitis: NEGATIVE
Chlamydia: NEGATIVE
Comment: NEGATIVE
Comment: NEGATIVE
Comment: NEGATIVE
Comment: NEGATIVE
Comment: NEGATIVE
Comment: NORMAL
Neisseria Gonorrhea: NEGATIVE
Trichomonas: NEGATIVE

## 2023-04-10 MED ORDER — METRONIDAZOLE 500 MG PO TABS: 500.0000 mg | ORAL_TABLET | Freq: Two times a day (BID) | ORAL | 0 refills | Status: DC

## 2023-04-10 NOTE — Addendum Note (Signed)
Addended by: Catalina Antigua on: 04/10/2023 03:05 PM   Modules accepted: Orders

## 2023-04-13 LAB — PANORAMA PRENATAL TEST FULL PANEL:PANORAMA TEST PLUS 5 ADDITIONAL MICRODELETIONS: FETAL FRACTION: 6.2

## 2023-04-17 LAB — HORIZON CUSTOM: REPORT SUMMARY: POSITIVE — AB

## 2023-05-02 NOTE — L&D Delivery Note (Addendum)
 LABOR COURSE Patient is a 38 yo F G4P3 initially presenting for IOL LGA, AMA. Augmentation with cytotec and AROM. GBS positive and received adequate intrapartum antibiotics.   Delivery Note Called to room and patient was complete and pushing. Head delivered without complication. Loose double nuchal cord present, easily reduced following delivery of body. Shoulder and body delivered in usual fashion.   At 1336 a viable female was delivered via Vaginal, Spontaneous (Presentation: ROA). Infant with spontaneous cry, placed on mother's abdomen, dried and stimulated. Cord clamped x 2 after 1-minute delay, and cut by FOB. Cord blood drawn. Placenta delivered spontaneously with gentle cord traction. Appears intact. Fundus firm with massage and Pitocin. Labia, perineum, vagina, and cervix inspected, found to have a 1st degree laceration, hemostatic without need for repair. Patient given prophylactic 800 mg buccal cytotec and IV TXA given increased risk for PPH.  APGAR: 9 at 1 min, 9 at 5 min Cord: 3VC without complication  Anesthesia:  Epidural Episiotomy: None Lacerations: 1st degree laceration, hemostatic and not repaired Suture Repair: None Est. Blood Loss (mL): 220 cc  Mom to postpartum.  Baby to Couplet care / Skin to Skin.  Claudene Wells LABOR, MD 10/24/2023 2:11 PM

## 2023-05-04 ENCOUNTER — Ambulatory Visit (INDEPENDENT_AMBULATORY_CARE_PROVIDER_SITE_OTHER): Payer: Medicaid Other | Admitting: Obstetrics and Gynecology

## 2023-05-04 ENCOUNTER — Encounter: Payer: Self-pay | Admitting: Obstetrics and Gynecology

## 2023-05-04 VITALS — BP 107/73 | HR 71 | Wt 280.0 lb

## 2023-05-04 DIAGNOSIS — Z6841 Body Mass Index (BMI) 40.0 and over, adult: Secondary | ICD-10-CM

## 2023-05-04 DIAGNOSIS — F32A Depression, unspecified: Secondary | ICD-10-CM

## 2023-05-04 DIAGNOSIS — Z3A14 14 weeks gestation of pregnancy: Secondary | ICD-10-CM

## 2023-05-04 DIAGNOSIS — Z148 Genetic carrier of other disease: Secondary | ICD-10-CM | POA: Insufficient documentation

## 2023-05-04 DIAGNOSIS — O09522 Supervision of elderly multigravida, second trimester: Secondary | ICD-10-CM

## 2023-05-04 DIAGNOSIS — O24919 Unspecified diabetes mellitus in pregnancy, unspecified trimester: Secondary | ICD-10-CM

## 2023-05-04 DIAGNOSIS — F3163 Bipolar disorder, current episode mixed, severe, without psychotic features: Secondary | ICD-10-CM

## 2023-05-04 DIAGNOSIS — Z2839 Other underimmunization status: Secondary | ICD-10-CM

## 2023-05-04 DIAGNOSIS — O09899 Supervision of other high risk pregnancies, unspecified trimester: Secondary | ICD-10-CM

## 2023-05-04 DIAGNOSIS — Z3480 Encounter for supervision of other normal pregnancy, unspecified trimester: Secondary | ICD-10-CM

## 2023-05-04 MED ORDER — METRONIDAZOLE 500 MG PO TABS: 500.0000 mg | ORAL_TABLET | Freq: Two times a day (BID) | ORAL | 0 refills | Status: DC

## 2023-05-04 NOTE — Progress Notes (Signed)
   PRENATAL VISIT NOTE  Subjective:  Joanna Neal is a 38 y.o. [phone] at [redacted]w[redacted]d being seen today for ongoing prenatal care.  She is currently monitored for the following issues for this high-risk pregnancy and has Bipolar affective disorder, mixed, severe (HCC); Depressive disorder; GERD (gastroesophageal reflux disease); Supervision of other normal pregnancy, antepartum; Rubella non-immune status, antepartum; Diabetes mellitus affecting pregnancy, antepartum; AMA (advanced maternal age) multigravida 35+; Maternal obesity affecting pregnancy, antepartum; and Carrier of spinal muscular atrophy on their problem list.  Patient reports nausea and occ cramping (2 episodes) .  Contractions: Not present. Vag. Bleeding: None.   . Denies leaking of fluid.   The following portions of the patient's history were reviewed and updated as appropriate: allergies, current medications, past family history, past medical history, past social history, past surgical history and problem list.   Objective:   Vitals:   05/04/23 0823  BP: 107/73  Pulse: 71  Weight: 280 lb (127 kg)    Fetal Status: Fetal Heart Rate (bpm): 156         General:  Alert, oriented and cooperative. Patient is in no acute distress.  Skin: Skin is warm and dry. No rash noted.   Cardiovascular: Normal heart rate noted  Respiratory: Normal respiratory effort, no problems with respiration noted  Abdomen: Soft, gravid, appropriate for gestational age.  Pain/Pressure: Absent      Assessment and Plan:  Pregnancy: G4P3003 at [redacted]w[redacted]d 1. Supervision of other normal pregnancy, antepartum (Primary) 2. [redacted] weeks gestation of pregnancy Discussed option for AFP next appointment Anatomy US  scheduled 2/11  3. Diabetes mellitus affecting pregnancy, antepartum - DM in patient's medical history since 2018. Reports that she was never diagnosed with diabetes but was told it needed to be coded that way for her metformin to be covered. She has not taken  metformin in several years. Early A1c this pregnancy 5.5 without medications - this is not even pre-diabetic range. She has never checked blood sugars.  - Will obtain early 2h GTT to clarify diagnosis - Fetal echo is likely unnecessary given this history/A1c  4. Bipolar affective disorder, mixed, severe (HCC) 5. Depressive disorder Reports diagnosis when she was around 20 and had a lot of stressors. No prior hospitalizations No current meds. Does not follow with [facility] or psych Feels well and declines interventions at this time. Reviewed IBH is available if anything changes  6. Rubella non-immune status, antepartum PP MMR  7. Multigravida of advanced maternal age in second trimester 8. 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 Bacterial vaginosis Rx for flagyl  resent  Please refer to After Visit Summary for other counseling recommendations.   Return in about 1 week (around 05/11/2023) for lab visit for 2h GTT.  Future Appointments  Date Time Provider Department Center  05/11/2023  8:15 AM [facility] LAB [facility] None  06/01/2023  8:35 AM Erik Kieth BROCKS, MD [facility] None  06/12/2023 10:15 AM [facility] NURSE [facility]  06/12/2023 10:30 AM [facility] US3 [facility]    Kieth BROCKS Erik, MD

## 2023-05-04 NOTE — Progress Notes (Signed)
 ROB, Pt have not picked up Rx for BV.

## 2023-05-11 ENCOUNTER — Other Ambulatory Visit: Payer: Medicaid Other

## 2023-05-11 ENCOUNTER — Encounter: Payer: Self-pay | Admitting: Obstetrics and Gynecology

## 2023-05-11 DIAGNOSIS — Z8639 Personal history of other endocrine, nutritional and metabolic disease: Secondary | ICD-10-CM

## 2023-05-12 ENCOUNTER — Encounter: Payer: Self-pay | Admitting: Obstetrics and Gynecology

## 2023-05-12 LAB — GLUCOSE TOLERANCE, 2 HOURS W/ 1HR
Glucose, 1 hour: 92 mg/dL (ref 70–179)
Glucose, 2 hour: 88 mg/dL (ref 70–152)
Glucose, Fasting: 79 mg/dL (ref 70–91)

## 2023-05-16 ENCOUNTER — Inpatient Hospital Stay (HOSPITAL_COMMUNITY)
Admission: AD | Admit: 2023-05-16 | Discharge: 2023-05-16 | Disposition: A | Discharge status: Home / Self Care | Payer: Medicaid Other | Attending: Obstetrics & Gynecology | Admitting: Obstetrics & Gynecology

## 2023-05-16 ENCOUNTER — Other Ambulatory Visit: Payer: Self-pay

## 2023-05-16 DIAGNOSIS — N949 Unspecified condition associated with female genital organs and menstrual cycle: Secondary | ICD-10-CM

## 2023-05-16 DIAGNOSIS — B9689 Other specified bacterial agents as the cause of diseases classified elsewhere: Secondary | ICD-10-CM | POA: Insufficient documentation

## 2023-05-16 DIAGNOSIS — K5904 Chronic idiopathic constipation: Secondary | ICD-10-CM

## 2023-05-16 DIAGNOSIS — Z3A16 16 weeks gestation of pregnancy: Secondary | ICD-10-CM | POA: Insufficient documentation

## 2023-05-16 DIAGNOSIS — O99612 Diseases of the digestive system complicating pregnancy, second trimester: Secondary | ICD-10-CM | POA: Insufficient documentation

## 2023-05-16 DIAGNOSIS — O23592 Infection of other part of genital tract in pregnancy, second trimester: Secondary | ICD-10-CM | POA: Diagnosis not present

## 2023-05-16 DIAGNOSIS — O26891 Other specified pregnancy related conditions, first trimester: Secondary | ICD-10-CM | POA: Diagnosis present

## 2023-05-16 DIAGNOSIS — K5900 Constipation, unspecified: Secondary | ICD-10-CM | POA: Insufficient documentation

## 2023-05-16 LAB — COMPREHENSIVE METABOLIC PANEL
ALT: 20 U/L (ref 0–44)
AST: 20 U/L (ref 15–41)
Albumin: 3 g/dL — ABNORMAL LOW (ref 3.5–5.0)
Alkaline Phosphatase: 47 U/L (ref 38–126)
Anion gap: 10 (ref 5–15)
BUN: 9 mg/dL (ref 6–20)
CO2: 22 mmol/L (ref 22–32)
Calcium: 9 mg/dL (ref 8.9–10.3)
Chloride: 103 mmol/L (ref 98–111)
Creatinine, Ser: 0.67 mg/dL (ref 0.44–1.00)
GFR, Estimated: >60 mL/min (ref >60)
Glucose, Bld: 80 mg/dL (ref 70–99)
Potassium: 3.8 mmol/L (ref 3.5–5.1)
Sodium: 135 mmol/L (ref 135–145)
Total Bilirubin: 0.6 mg/dL (ref 0.0–1.2)
Total Protein: 6 g/dL — ABNORMAL LOW (ref 6.5–8.1)

## 2023-05-16 LAB — URINALYSIS, ROUTINE W REFLEX MICROSCOPIC
Bilirubin Urine: NEGATIVE
Glucose, UA: NEGATIVE mg/dL
Hgb urine dipstick: NEGATIVE
Ketones, ur: NEGATIVE mg/dL
Leukocytes,Ua: NEGATIVE
Nitrite: NEGATIVE
Protein, ur: NEGATIVE mg/dL
Specific Gravity, Urine: 1.016 (ref 1.005–1.030)
pH: 6 (ref 5.0–8.0)

## 2023-05-16 LAB — CBC WITH DIFFERENTIAL/PLATELET
Abs Immature Granulocytes: 0.04 10*3/uL (ref 0.00–0.07)
Basophils Absolute: 0.1 10*3/uL (ref 0.0–0.1)
Basophils Relative: 0 %
Eosinophils Absolute: 0.1 10*3/uL (ref 0.0–0.5)
Eosinophils Relative: 1 %
HCT: 32.1 % — ABNORMAL LOW (ref 36.0–46.0)
Hemoglobin: 10.8 g/dL — ABNORMAL LOW (ref 12.0–15.0)
Immature Granulocytes: 0 %
Lymphocytes Relative: 21 %
Lymphs Abs: 2.4 10*3/uL (ref 0.7–4.0)
MCH: 30.6 pg (ref 26.0–34.0)
MCHC: 33.6 g/dL (ref 30.0–36.0)
MCV: 90.9 fL (ref 80.0–100.0)
Monocytes Absolute: 0.6 10*3/uL (ref 0.1–1.0)
Monocytes Relative: 5 %
Neutro Abs: 8.4 10*3/uL — ABNORMAL HIGH (ref 1.7–7.7)
Neutrophils Relative %: 73 %
Platelets: 212 10*3/uL (ref 150–400)
RBC: 3.53 MIL/uL — ABNORMAL LOW (ref 3.87–5.11)
RDW: 13 % (ref 11.5–15.5)
WBC: 11.5 10*3/uL — ABNORMAL HIGH (ref 4.0–10.5)
nRBC: 0 % (ref 0.0–0.2)

## 2023-05-16 MED ORDER — POLYETHYLENE GLYCOL 3350 17 G PO PACK: 17.0000 g | PACK | Freq: Every day | ORAL | 0 refills | Status: DC | PRN

## 2023-05-16 MED ORDER — METRONIDAZOLE 500 MG PO TABS: 500.0000 mg | ORAL_TABLET | Freq: Two times a day (BID) | ORAL | 0 refills | Status: DC

## 2023-05-16 MED ORDER — DOCUSATE SODIUM 100 MG PO CAPS
100.0000 mg | ORAL_CAPSULE | Freq: Two times a day (BID) | ORAL | 0 refills | Status: DC
Start: 2023-05-16 — End: 2023-10-26

## 2023-05-16 NOTE — MAU Provider Note (Signed)
Pelvic Pain     S [name] is a 38 y.o. [phone] pregnant female at [redacted]w[redacted]d who presents to MAU today with complaint of L sided pelvic pain.  States pain begain 1/14, constant, worse with movements, relief once she lays down.  Dx'ed with BV but has not picked up her Flagyl from 1/3.  Denies VB, LOF.  +Flutters. Denies F/C, N/V, change in urine / stools.  Pt does state she had chronic constipation.   Receives care at [facility]. Prenatal records reviewed.  Pertinent items noted in HPI and remainder of comprehensive ROS otherwise negative.   O BP 129/75 (BP Location: Right Arm)   Pulse 72   Temp 98.5 F (36.9 C) (Oral)   Resp 16   Ht 5\' 7"  (1.702 m)   Wt 130.3 kg   LMP 01/22/2023   SpO2 100%   BMI 44.98 kg/m  Physical Exam Vitals and nursing note reviewed.  Constitutional:      General: She is not in acute distress.    Appearance: Normal appearance. She is obese. She is not ill-appearing.  HENT:     Head: Normocephalic and atraumatic.     Right Ear: External ear normal.     Left Ear: External ear normal.     Nose: Nose normal.     Mouth/Throat:     Mouth: Mucous membranes are moist.     Pharynx: Oropharynx is clear.  Eyes:     Extraocular Movements: Extraocular movements intact.     Conjunctiva/sclera: Conjunctivae normal.  Cardiovascular:     Rate and Rhythm: Normal rate.  Pulmonary:     Effort: Pulmonary effort is normal. No respiratory distress.  Abdominal:     General: Abdomen is flat.     Palpations: Abdomen is soft.     Tenderness: There is abdominal tenderness (b/l round ligament TTP L>R, relief with abdominal lift).  Musculoskeletal:        General: No swelling. Normal range of motion.     Cervical back: Normal range of motion.  Skin:    General: Skin is warm and dry.  Neurological:     Mental Status: She is alert and oriented to person, place, and time. Mental status is at baseline.  Psychiatric:        Mood and Affect: Mood normal.        Behavior:  Behavior normal.      MDM: MAU Course:  UA noninfectiou CBCdiff WBC 11.5 / Neutro Abs 8.4, Hgb 10.8 CMP wnl    A/P: #[redacted] weeks gestation  #Constipation #Round ligament pain #Bacterial Vaginitis - miralax and stool softener  - belly bands / K tape, massages  - resent Flagyl    Discharge from MAU in stable condition with strict/usual precautions Follow up at [facility] as scheduled for ongoing prenatal care  Allergies as of 05/16/2023   No Known Allergies      Medication List     TAKE these medications    aspirin EC 81 MG tablet Take 1 tablet (81 mg total) by mouth daily. Swallow whole.   Blood Pressure Kit Devi 1 Device by Does not apply route once a week.   docusate sodium 100 MG capsule Commonly known as: COLACE Take 1 capsule (100 mg total) by mouth 2 (two) times daily.   Doxylamine-Pyridoxine 10-10 MG Tbec Commonly known as: Diclegis Take 2 tablets by mouth at bedtime. If symptoms persist, add one tablet in the morning and one in the afternoon   metroNIDAZOLE  500 MG tablet Commonly known as: FLAGYL Take 1 tablet (500 mg total) by mouth 2 (two) times daily.   omeprazole 20 MG capsule Commonly known as: PRILOSEC Take 20 mg by mouth daily.   polyethylene glycol 17 g packet Commonly known as: MiraLax Take 17 g by mouth daily as needed.   Prenate Mini 18-0.6-0.4-350 MG Caps Take 1 tablet by mouth daily.   promethazine 25 MG tablet Commonly known as: PHENERGAN Take 1 tablet (25 mg total) by mouth every 6 (six) hours as needed for nausea or vomiting.        Hessie Dibble, MD 05/16/2023 8:41 PM

## 2023-05-16 NOTE — Discharge Instructions (Signed)

## 2023-05-16 NOTE — MAU Note (Addendum)
...  Joanna Neal is a 38 y.o. at [redacted]w[redacted]d here in MAU reporting: Pelvic pain that began yesterday. She reports the pain is constant but worse with movement. 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 She reports when she lies down her pains begin to ease. She reports she has not picked up her Flagyl  for her BV that she was diagnosed with on 1/3. Denies VB or LOF. +flutters.  Patient reports she is concerned because she is not showing yet in her pregnancy.   Has not taken anything for the pain.  Onset of complaint: Yesterday Pain score: 8/10 pelvis  Vitals:   05/16/23 1847  BP: 129/75  Pulse: 72  Resp: 16  Temp: 98.5 F (36.9 C)  SpO2: 100%      FHT: 158 doppler Lab orders placed from triage: UA

## 2023-06-01 ENCOUNTER — Ambulatory Visit (INDEPENDENT_AMBULATORY_CARE_PROVIDER_SITE_OTHER): Payer: Medicaid Other | Admitting: Obstetrics and Gynecology

## 2023-06-01 VITALS — BP 117/74 | HR 70 | Wt 292.2 lb

## 2023-06-01 DIAGNOSIS — Z148 Genetic carrier of other disease: Secondary | ICD-10-CM

## 2023-06-01 DIAGNOSIS — Z6841 Body Mass Index (BMI) 40.0 and over, adult: Secondary | ICD-10-CM

## 2023-06-01 DIAGNOSIS — R7309 Other abnormal glucose: Secondary | ICD-10-CM

## 2023-06-01 DIAGNOSIS — O09522 Supervision of elderly multigravida, second trimester: Secondary | ICD-10-CM

## 2023-06-01 DIAGNOSIS — Z3A18 18 weeks gestation of pregnancy: Secondary | ICD-10-CM

## 2023-06-01 DIAGNOSIS — Z3480 Encounter for supervision of other normal pregnancy, unspecified trimester: Secondary | ICD-10-CM

## 2023-06-01 DIAGNOSIS — O09899 Supervision of other high risk pregnancies, unspecified trimester: Secondary | ICD-10-CM

## 2023-06-01 DIAGNOSIS — F32A Depression, unspecified: Secondary | ICD-10-CM

## 2023-06-01 DIAGNOSIS — Z2839 Other underimmunization status: Secondary | ICD-10-CM

## 2023-06-01 DIAGNOSIS — F3163 Bipolar disorder, current episode mixed, severe, without psychotic features: Secondary | ICD-10-CM

## 2023-06-01 NOTE — Progress Notes (Signed)
 Pt. Presents for rob. Pt. Has no questions or concerns at this time.

## 2023-06-01 NOTE — Progress Notes (Signed)
   PRENATAL VISIT NOTE  Subjective:  Joanna Neal is a 38 y.o. [phone] at [redacted]w[redacted]d being seen today for ongoing prenatal care.  She is currently monitored for the following issues for this low-risk pregnancy and has Bipolar affective disorder, mixed, severe (HCC); Depressive disorder; GERD (gastroesophageal reflux disease); Supervision of other normal pregnancy, antepartum; Rubella non-immune status, antepartum; Abnormal glucose tolerance test; AMA (advanced maternal age) multigravida 35+; Maternal obesity affecting pregnancy, antepartum; and Carrier of spinal muscular atrophy on their problem list.  Patient reports  trouble sleeping .  Contractions: Not present. Vag. Bleeding: None.  Movement: Absent. Denies leaking of fluid.   The following portions of the patient's history were reviewed and updated as appropriate: allergies, current medications, past family history, past medical history, past social history, past surgical history and problem list.   Objective:   Vitals:   06/01/23 0831  BP: 117/74  Pulse: 70  Weight: 292 lb 3.2 oz (132.5 kg)   Fetal Status: Fetal Heart Rate (bpm): 160 (Simultaneous filing. User may not have seen previous data.)   Movement: Absent     General:  Alert, oriented and cooperative. Patient is in no acute distress.  Skin: Skin is warm and dry. No rash noted.   Cardiovascular: Normal heart rate noted  Respiratory: Normal respiratory effort, no problems with respiration noted  Abdomen: Soft, gravid, appropriate for gestational age.  Pain/Pressure: Present      Assessment and Plan:  Pregnancy: G4P3003 at [redacted]w[redacted]d 1. Supervision of other normal pregnancy, antepartum (Primary) 2. [redacted] weeks gestation of pregnancy Discussed AFP, pt accepts Anatomy US scheduled 2/11 Discussed strategies for sleep hygiene, safe sleep etc  3. Bipolar affective disorder, mixed, severe (HCC) 4. Depressive disorder Reports diagnosis when she was around 20 and had a lot of stressors.  No prior hospitalizations  No current medications, does not follow with BH/psych IBH previously discussed  5. Rubella non-immune status, antepartum PP MMR  6. Abnormal glucose tolerance test Normal early 2h GTT, plan for routine screening  7. Multigravida of advanced maternal age in second trimester 8. BMI 40.0-44.9, adult (HCC) LR NIPS ldASA  9. Carrier of spinal muscular atrophy Partner testing previously discussed, not yet completed  Please refer to After Visit Summary for other counseling recommendations.   Return in about 5 weeks (around 07/06/2023) for return OB at 23 weeks.  Future Appointments  Date Time Provider Department Center  06/12/2023  7:15 AM [facility] NURSE [facility]  06/12/2023  7:30 AM [facility] US2 [facility]  06/12/2023  8:30 AM [facility] PROVIDER 1 [facility]    Lennart Pall, MD

## 2023-06-03 LAB — AFP, SERUM, OPEN SPINA BIFIDA
AFP MoM: 1.18
AFP Value: 40.1 ng/mL
Gest. Age on Collection Date: 18.4 wk
Maternal Age At EDD: 36.1 a
OSBR Risk 1 IN: 10000
Test Results:: NEGATIVE
Weight: 292 [lb_av]

## 2023-06-04 ENCOUNTER — Encounter: Payer: Self-pay | Admitting: Obstetrics and Gynecology

## 2023-06-04 DIAGNOSIS — Z8639 Personal history of other endocrine, nutritional and metabolic disease: Secondary | ICD-10-CM | POA: Insufficient documentation

## 2023-06-12 ENCOUNTER — Other Ambulatory Visit: Payer: Self-pay

## 2023-06-12 ENCOUNTER — Other Ambulatory Visit: Payer: Self-pay | Admitting: *Deleted

## 2023-06-12 ENCOUNTER — Ambulatory Visit: Payer: Medicaid Other | Attending: Obstetrics and Gynecology | Admitting: *Deleted

## 2023-06-12 ENCOUNTER — Other Ambulatory Visit: Payer: Medicaid Other

## 2023-06-12 ENCOUNTER — Ambulatory Visit (HOSPITAL_BASED_OUTPATIENT_CLINIC_OR_DEPARTMENT_OTHER): Payer: Medicaid Other

## 2023-06-12 ENCOUNTER — Ambulatory Visit (HOSPITAL_BASED_OUTPATIENT_CLINIC_OR_DEPARTMENT_OTHER): Payer: Medicaid Other | Admitting: Maternal & Fetal Medicine

## 2023-06-12 ENCOUNTER — Ambulatory Visit: Payer: No Typology Code available for payment source

## 2023-06-12 VITALS — BP 128/70 | HR 75

## 2023-06-12 DIAGNOSIS — Z8639 Personal history of other endocrine, nutritional and metabolic disease: Secondary | ICD-10-CM

## 2023-06-12 DIAGNOSIS — O09512 Supervision of elderly primigravida, second trimester: Secondary | ICD-10-CM | POA: Insufficient documentation

## 2023-06-12 DIAGNOSIS — O09522 Supervision of elderly multigravida, second trimester: Secondary | ICD-10-CM

## 2023-06-12 DIAGNOSIS — O99212 Obesity complicating pregnancy, second trimester: Secondary | ICD-10-CM | POA: Diagnosis present

## 2023-06-12 DIAGNOSIS — O0992 Supervision of high risk pregnancy, unspecified, second trimester: Secondary | ICD-10-CM

## 2023-06-12 DIAGNOSIS — Z3A20 20 weeks gestation of pregnancy: Secondary | ICD-10-CM | POA: Diagnosis not present

## 2023-06-12 DIAGNOSIS — Z3480 Encounter for supervision of other normal pregnancy, unspecified trimester: Secondary | ICD-10-CM | POA: Diagnosis not present

## 2023-06-12 DIAGNOSIS — O99210 Obesity complicating pregnancy, unspecified trimester: Secondary | ICD-10-CM | POA: Diagnosis not present

## 2023-06-12 DIAGNOSIS — R7309 Other abnormal glucose: Secondary | ICD-10-CM

## 2023-06-12 NOTE — Progress Notes (Signed)
Patient information  Patient Name: Joanna Neal  Patient MRN:   [id number]  Referring practice: MFM Referring Provider: [facility])  MFM CONSULT  Joanna Neal is a 38 y.o. [phone] at [redacted]w[redacted]d here for ultrasound and consultation. Patient Active Problem List   Diagnosis Date Noted   History of diabetes mellitus-not with pregnancy-was on Metformin-d/ced 3-4 years ago 06/04/2023   Carrier of spinal muscular atrophy-^ risk 05/04/2023   Abnormal glucose tolerance test 04/06/2023   AMA (advanced maternal age) multigravida 35+ 04/06/2023   Maternal obesity affecting pregnancy, antepartum 04/06/2023   Rubella non-immune status, antepartum 03/24/2023   GERD (gastroesophageal reflux disease) 03/23/2023   Supervision of other normal pregnancy, antepartum 03/23/2023   Bipolar affective disorder, mixed, severe (HCC) 10/05/2012   Depressive disorder 10/05/2012   Joanna Neal is doing well today with no acute concerns. She denies contractions, bleeding, or loss of fluid and reports good fetal movement.   RE elevated BMI: Pre-gravid BMI is 40.  Discussed the need for antenatal testing around 34 weeks and serial growth ultrasounds.  There is a higher inability to detect certain ultrasound abnormalities due to poor acoustic windows in pregnancies complicated by elevated BMI.  RE AMA: Low risk NIPT.   RE hx of DM: Patient has history of diabetes and was treated with metformin but then she was able to achieve a normal hemoglobin A1c with diet and exercise.  She has not required medicine for about 3 years.  She had an early glucose tolerance test that was normal.  Counseling I discussed the potential adverse obstetric, fetal and neonatal associations with each specific pregnancy complication. I discussed the risk of fetal growth abnormalities and stillbirth based on her pregnancy risk factors. I discussed the role of antenatal testing, serial growth ultrasounds and  the timing of delivery based on the clinical course. I discussed the risk and impact of preeclampsia on her pregnancy and the role of baseline laboratory assessment of kidney, liver and platelet count as well as the role of low dose Aspirin to the reduce the risk of developing preeclampsia. I reassured the patient that we expect a favorable pregnancy outcome but due to her pregnancy complications she will need a higher level of monitoring for her pregnancy compared to a pregnancy without complications. The patient had time to ask questions that were answered to her satisfaction. She verbalized understanding of our discussion and request to proceed with the plan outlined in the recommendations.   Sonographic findings Single intrauterine pregnancy at 20w 1d  Fetal cardiac activity:  Observed and appears normal. Presentation: Breech. The anatomic structures that were well seen appear normal without evidence of soft markers. Due to poor acoustic windows some structures remain suboptimally visualized. Fetal biometry shows the estimated fetal weight at the 89 percentile.  Amniotic fluid: Within normal limits.  MVP: 6.38 cm. Placenta: Anterior Fundal. Adnexa: No abnormality visualized. Cervical length: 4 cm.  There are limitations of prenatal ultrasound such as the inability to detect certain abnormalities due to poor visualization. Various factors such as fetal position, gestational age and maternal body habitus may increase the difficulty in visualizing the fetal anatomy.    Recommendations -EDD is Estimated Date of Delivery: 10/29/23 based on LMP (one day off from early 8w [place]) -Detailed ultrasound was done today without abnormalities but some of the anatomy remains suboptimally visualized.. -Baseline preeclampsia labs: CMP, CBC and urine protein/creatinine was completed and was normal. -Continue Aspirin 81 mg for  preeclampsia prophylaxis -Follow-up anatomy and fetal growth in 4 to 6 weeks -Serial  growth ultrasounds starting around 28 weeks to monitor for fetal growth restriction -Antenatal testing to start around 34 weeks due to the increased risk of stillbirth and high risk pregnancy -Delivery timing pending clinical course but likely around 39 weeks gestion -Continue routine prenatal care with referring OB provider  Review of Systems: A review of systems was performed and was negative except per HPI   Vitals and Physical Exam    06/12/2023    7:41 AM 06/01/2023    8:31 AM 05/16/2023    8:49 PM  Vitals with BMI  Weight  292 lbs 3 oz   BMI  45.75   Systolic 128 117 914  Diastolic 70 74 67  Pulse 75 70 72    Sitting comfortably on the sonogram table Nonlabored breathing Normal rate and rhythm Abdomen is nontender  Past pregnancies OB History  Gravida Para Term Preterm AB Living  4 3 3  0 0 3  SAB IAB Ectopic Multiple Live Births  0 0 0 0 3    # Outcome Date GA Lbr Len/2nd Weight Sex Type Anes PTL Lv  4 Current           3 Term 12/16/11     Vag-Spont   LIV  2 Term 02/07/09     Vag-Spont   LIV  1 Term 01/29/06     Vag-Spont   LIV     I spent 30 minutes reviewing the patients chart, including labs and images as well as counseling the patient about her medical conditions. Greater than 50% of the time was spent in direct face-to-face patient counseling.  Braxton Feathers  MFM, [facility]   06/12/2023  9:12 AM

## 2023-06-13 ENCOUNTER — Encounter: Payer: Self-pay | Admitting: Obstetrics and Gynecology

## 2023-07-05 ENCOUNTER — Ambulatory Visit (INDEPENDENT_AMBULATORY_CARE_PROVIDER_SITE_OTHER): Payer: MEDICAID

## 2023-07-05 VITALS — BP 126/82 | HR 70 | Wt 297.2 lb

## 2023-07-05 DIAGNOSIS — Z1339 Encounter for screening examination for other mental health and behavioral disorders: Secondary | ICD-10-CM | POA: Diagnosis not present

## 2023-07-05 DIAGNOSIS — O0992 Supervision of high risk pregnancy, unspecified, second trimester: Secondary | ICD-10-CM

## 2023-07-05 DIAGNOSIS — Z6841 Body Mass Index (BMI) 40.0 and over, adult: Secondary | ICD-10-CM

## 2023-07-05 DIAGNOSIS — Z8639 Personal history of other endocrine, nutritional and metabolic disease: Secondary | ICD-10-CM

## 2023-07-05 DIAGNOSIS — Z3A23 23 weeks gestation of pregnancy: Secondary | ICD-10-CM | POA: Diagnosis not present

## 2023-07-05 DIAGNOSIS — G479 Sleep disorder, unspecified: Secondary | ICD-10-CM

## 2023-07-05 DIAGNOSIS — F32A Depression, unspecified: Secondary | ICD-10-CM

## 2023-07-05 MED ORDER — PRENATAL ADULT GUMMY/DHA/FA 0.4-25 MG PO CHEW: 1.0000 [IU] | CHEWABLE_TABLET | Freq: Every day | ORAL | 2 refills | Status: DC

## 2023-07-05 NOTE — Patient Instructions (Signed)
 Safe Medications in Pregnancy   Acne: Benzoyl Peroxide Salicylic Acid  Backache/Headache: Tylenol: 2 regular strength every 4 hours OR              2 Extra strength every 6 hours  Colds/Coughs/Allergies: Benadryl (alcohol free) 25 mg every 6 hours as needed Breath right strips Claritin Cepacol throat lozenges Chloraseptic throat spray Cold-Eeze- up to three times per day Cough drops, alcohol free Flonase (by prescription only) Guaifenesin Mucinex Robitussin DM (plain only, alcohol free) Saline nasal spray/drops Sudafed (pseudoephedrine) & Actifed ** use only after [redacted] weeks gestation and if you do not have high blood pressure Tylenol Vicks Vaporub Zinc lozenges Zyrtec   Constipation: Colace Ducolax suppositories Fleet enema Glycerin suppositories Metamucil Milk of magnesia Miralax Senokot Smooth move tea  Diarrhea: Kaopectate Imodium A-D  *NO pepto Bismol  Hemorrhoids: Anusol Anusol HC Preparation H Tucks  Indigestion: Tums Maalox Mylanta Zantac  Pepcid  Insomnia/Sleep Difficulties: Benadryl (alcohol free) 25mg  every 6 hours as needed Tylenol PM Unisom, no Gelcaps  Leg Cramps: Tums MagGel  Nausea/Vomiting:  Bonine Dramamine Emetrol Ginger extract Sea bands Meclizine  Nausea medication to take during pregnancy:  Unisom (doxylamine succinate 25 mg tablets) Take one tablet daily at bedtime. If symptoms are not adequately controlled, the dose can be increased to a maximum recommended dose of two tablets daily (1/2 tablet in the morning, 1/2 tablet mid-afternoon and one at bedtime). Vitamin B6 100mg  tablets. Take one tablet twice a day (up to 200 mg per day).  Skin Rashes: Aveeno products Benadryl cream or 25mg  every 6 hours as needed Calamine Lotion 1% cortisone cream  Yeast infection: Gyne-lotrimin 7 Monistat 7  Gum/tooth pain: Anbesol  **If taking multiple medications, please check labels to avoid duplicating the same active  ingredients **take medication as directed on the label ** Do not exceed 4000 mg of tylenol in 24 hours **Do not take medications that contain aspirin or ibuprofen

## 2023-07-05 NOTE — Progress Notes (Signed)
 Pt. Presents for rob. Pt would like a different prenatal, states that the current one is hard to swallow. Pt would also like her iron checked due to being tired and having little energy.

## 2023-07-05 NOTE — Progress Notes (Signed)
 HIGH-RISK PREGNANCY OFFICE VISIT  Patient name: Joanna Neal MRN [id number]  Date of birth: 06/29/1985 Chief Complaint:   Routine Prenatal Visit  Subjective:   Joanna Neal is a 38 y.o. G72P3003 female at [redacted]w[redacted]d with an Estimated Date of Delivery: 10/29/23 being seen today for ongoing management of a high-risk pregnancy aeb has Bipolar affective disorder, mixed, severe (HCC); Depressive disorder; GERD (gastroesophageal reflux disease); Supervision of high risk pregnancy, antepartum, second trimester; Rubella non-immune status, antepartum; AMA (advanced maternal age) multigravida 35+; BMI 40.0-44.9, adult (HCC); Carrier of spinal muscular atrophy-^ risk; and History of diabetes mellitus-not with pregnancy-was on Metformin-d/ced 3-4 years ago on their problem list.  Patient presents today, alone, with c/o fatigue. She reports continued sleep disturbances. She requests note for work to get additional rest.  Patient endorses fetal movement. Patient denies abdominal cramping or contractions.  She reports some "sharp pains during the bottom." However, she contributes this to overworking. Patient denies vaginal concerns including abnormal discharge, leaking of fluid, and bleeding. No issues with urination, constipation, or diarrhea.    Contractions: Not present. Vag. Bleeding: None.  Movement: Present.  Reviewed past medical,surgical, social, obstetrical and family history as well as problem list, medications and allergies.  Objective   Vitals:   07/05/23 0831  BP: 126/82  Pulse: 70  Weight: 297 lb 3.2 oz (134.8 kg)  Body mass index is 46.55 kg/m.  Total Weight Gain:37 lb 3.2 oz (16.9 kg)         Physical Examination:   General appearance: Well appearing, and in no distress  Mental status: Alert, oriented to person, place, and time  Skin: Warm & dry  Cardiovascular: Normal heart rate noted  Respiratory: Normal respiratory effort, no distress  Abdomen: Soft, gravid, nontender, LGA  with Fundal Height: 28 cm  Pelvic: Cervical exam deferred           Extremities: Edema: None  Fetal Status: Fetal Heart Rate (bpm): 154  Movement: Present   No results found for this or any previous visit (from the past 24 hours).  Assessment & Plan:  High-risk pregnancy of a 38 y.o., [id number] at [redacted]w[redacted]d with an Estimated Date of Delivery: 10/29/23   1. Supervision of high risk pregnancy, antepartum, second trimester -Anticipatory guidance for upcoming appts. -Patient to schedule next appt in 4 weeks for an in-person visit.  2. [redacted] weeks gestation of pregnancy -Doing well. -Addressed concerns.   3. Depressive disorder -Patient denies feelings of depression.  -PHQ-9: 4  4. BMI 40.0-44.9, adult (HCC) -TWG 37 lbs -Patient states she plans to start walking regimen. -Was initially taking bAS, but had some issues with insurance/pharmacy. Plans to get this taken care of today.   -Serial growths [place] to start around 28 weeks per MFM.  Next appt 3/12  5. History of diabetes mellitus-not with pregnancy-was on Metformin-d/ced 3-4 years ago -Early GTT normal. -Plan 3rd trimester glucose testing.  -Reviewed Glucola appt preparation including fasting the night before and morning of.   *Informed that it is okay to drink plain water throughout the night and prior to consumption of Glucola formula.  -Discussed anticipated office time of 2.5-3 hours.  -Reviewed blood draw procedures and labs which also include check of iron/HgB level, RPR, and HIV *Informed that repeat RPR/HIV are for pediatric records/compliance.  -Discussed how results of GTT are handled including diabetic education and BS testing for abnormal results and routine care for normal results.   6. Sleep disturbances -Does not take any medications. -  Discussed sleep habits and aides. -List of pregnancy safe medications      Meds:  Meds ordered this encounter  Medications   Prenatal MV & Min w/FA-DHA (PRENATAL ADULT GUMMY/DHA/FA)  0.4-25 MG CHEW    Sig: Chew 1 Units by mouth daily.    Dispense:  90 tablet    Refill:  2    Supervising Provider:   Reva Bores [[id number]]   Labs/procedures today:  Lab Orders  No laboratory test(s) ordered today     Reviewed: Preterm labor symptoms and general obstetric precautions including but not limited to vaginal bleeding, contractions, leaking of fluid and fetal movement were reviewed in detail with the patient.  All questions were answered.  Follow-up: Return in about 4 weeks (around 08/02/2023) for HROB with GTT.  No orders of the defined types were placed in this encounter.  Cherre Robins MSN, CNM 07/05/2023

## 2023-07-11 ENCOUNTER — Other Ambulatory Visit: Payer: Self-pay

## 2023-07-11 ENCOUNTER — Ambulatory Visit (HOSPITAL_BASED_OUTPATIENT_CLINIC_OR_DEPARTMENT_OTHER): Payer: MEDICAID

## 2023-07-11 ENCOUNTER — Other Ambulatory Visit: Payer: Self-pay | Admitting: Obstetrics and Gynecology

## 2023-07-11 ENCOUNTER — Other Ambulatory Visit: Payer: Self-pay | Admitting: *Deleted

## 2023-07-11 ENCOUNTER — Ambulatory Visit: Payer: MEDICAID | Attending: Maternal & Fetal Medicine | Admitting: *Deleted

## 2023-07-11 VITALS — BP 121/61 | HR 78

## 2023-07-11 DIAGNOSIS — O99212 Obesity complicating pregnancy, second trimester: Secondary | ICD-10-CM | POA: Insufficient documentation

## 2023-07-11 DIAGNOSIS — O09522 Supervision of elderly multigravida, second trimester: Secondary | ICD-10-CM

## 2023-07-11 DIAGNOSIS — O99342 Other mental disorders complicating pregnancy, second trimester: Secondary | ICD-10-CM

## 2023-07-11 DIAGNOSIS — Z8639 Personal history of other endocrine, nutritional and metabolic disease: Secondary | ICD-10-CM

## 2023-07-11 DIAGNOSIS — O0992 Supervision of high risk pregnancy, unspecified, second trimester: Secondary | ICD-10-CM

## 2023-07-11 DIAGNOSIS — E669 Obesity, unspecified: Secondary | ICD-10-CM

## 2023-07-11 DIAGNOSIS — O3663X0 Maternal care for excessive fetal growth, third trimester, not applicable or unspecified: Secondary | ICD-10-CM | POA: Diagnosis not present

## 2023-07-11 DIAGNOSIS — Z3480 Encounter for supervision of other normal pregnancy, unspecified trimester: Secondary | ICD-10-CM

## 2023-07-11 DIAGNOSIS — Z3A24 24 weeks gestation of pregnancy: Secondary | ICD-10-CM | POA: Insufficient documentation

## 2023-07-11 DIAGNOSIS — Z6841 Body Mass Index (BMI) 40.0 and over, adult: Secondary | ICD-10-CM

## 2023-07-11 DIAGNOSIS — F319 Bipolar disorder, unspecified: Secondary | ICD-10-CM

## 2023-07-11 MED ORDER — BLOOD PRESSURE KIT DEVI
1.0000 | 0 refills | Status: DC
Start: 2023-07-11 — End: 2023-10-26

## 2023-07-11 NOTE — Progress Notes (Signed)
 Pt did not get BP cuff, order expired per pharmacy, new order sent in

## 2023-08-02 ENCOUNTER — Ambulatory Visit (INDEPENDENT_AMBULATORY_CARE_PROVIDER_SITE_OTHER): Payer: MEDICAID

## 2023-08-02 ENCOUNTER — Other Ambulatory Visit: Payer: MEDICAID

## 2023-08-02 VITALS — BP 125/72 | HR 81 | Wt 310.0 lb

## 2023-08-02 DIAGNOSIS — Z3403 Encounter for supervision of normal first pregnancy, third trimester: Secondary | ICD-10-CM | POA: Diagnosis not present

## 2023-08-02 DIAGNOSIS — O2600 Excessive weight gain in pregnancy, unspecified trimester: Secondary | ICD-10-CM

## 2023-08-02 DIAGNOSIS — F32A Depression, unspecified: Secondary | ICD-10-CM

## 2023-08-02 DIAGNOSIS — O24420 Gestational diabetes mellitus in childbirth, diet controlled: Secondary | ICD-10-CM

## 2023-08-02 DIAGNOSIS — Z3A27 27 weeks gestation of pregnancy: Secondary | ICD-10-CM

## 2023-08-02 DIAGNOSIS — O99019 Anemia complicating pregnancy, unspecified trimester: Secondary | ICD-10-CM

## 2023-08-02 DIAGNOSIS — O0992 Supervision of high risk pregnancy, unspecified, second trimester: Secondary | ICD-10-CM | POA: Diagnosis not present

## 2023-08-02 NOTE — Progress Notes (Signed)
 HIGH-RISK PREGNANCY OFFICE VISIT  Patient name: Joanna Neal MRN [id number]  Date of birth: 22-Jun-1985 Chief Complaint:   Routine Prenatal Visit  Subjective:   COLA Joanna Neal is a 38 y.o. 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  Patient presents today, alone, with no complaints, but concerns.  Patient endorses fetal movement. Patient denies abdominal cramping or contractions.  Patient denies vaginal concerns including abnormal discharge, leaking of fluid, and bleeding. No issues with urination, constipation, or diarrhea.   Patient reports some sharp abdominal pain with movement while laying down.  She states the pain is located in upper abdominal area and does not last long.   Patient also expresses concern about weight gain.  She states she eats only about twice daily and walks 3x/week for 30-60 min. She states it is making her a "little down."  Patient tearful regarding this.    Contractions: Not present. Vag. Bleeding: None.  Movement: Present.  Reviewed past medical,surgical, social, obstetrical and family history as well as problem list, medications and allergies.  Objective   Vitals:   08/02/23 0911  BP: 125/72  Pulse: 81  Weight: (!) 310 lb (140.6 kg)  Body mass index is 48.55 kg/m.  Total Weight Gain:50 lb (22.7 kg)         Physical Examination:   General appearance: Well appearing, and in no distress  Mental status: Alert, oriented  to person, place, and time  Skin: Warm & dry  Cardiovascular: Normal heart rate noted  Respiratory: Normal respiratory effort, no distress  Abdomen: Soft, gravid, nontender, LGA with Fundal Height: 41 cm  Pelvic: Cervical exam deferred           Extremities: Edema: None  Fetal Status: Fetal Heart Rate (bpm): 154  Movement: Present   No results found for this or any previous visit (from the past 24 hours).  Assessment & Plan:  High-risk pregnancy of a 38 y.o., Joanna Neal at [redacted]w[redacted]d with an Estimated Date of Delivery: 10/29/23   1. Supervision of high risk pregnancy, antepartum, second trimester -Anticipatory guidance for upcoming appts. -Patient to schedule next appt in 2-3 weeks for an in-person visit. -Growth F/U scheduled for 4/10. -Glucola appt completed today.  -Reviewed blood draw procedures and labs which also include check of iron/HgB level, RPR, and HIV *Informed that repeat RPR/HIV are for pediatric records/compliance.  -Discussed how results of GTT are handled including diabetic education and BS testing for abnormal results and routine care for normal results.   2. [redacted] weeks gestation of pregnancy -Doing well overall. -Reviewed concerns regarding weight gain.  -Reassurance given regarding abdominal pain when laying down. Instructed to monitor and report worsening condition.   3. Excessive weight gain in pregnancy -TWG 50lbs -Patient tearful; support given.  -Encouraged to continue walking. -Discussed modifications in nutritional intake. Encouraged high protein snacks and appropriate portion sizes. -Patient offered and declines nutritional consult.   5. Depressive disorder -PHQ 6 -Slight increase from last month (4). -GAD 1, which is down from last month (2).     Meds: No orders of the  defined types were placed in this encounter.  Labs/procedures today: Lab Orders         Glucose Tolerance, 2 Hours w/1 Hour         CBC         HIV antibody (with reflex)         RPR       Reviewed: Preterm labor symptoms and general obstetric precautions including but not limited to vaginal bleeding, contractions, leaking of fluid and fetal movement were reviewed in detail with the patient.  All questions were answered.  Follow-up: No follow-ups on file.  Orders Placed This Encounter  Procedures   Glucose Tolerance, 2 Hours w/1 Hour   CBC   HIV antibody (with reflex)   RPR   Cherre Robins MSN, CNM 08/02/2023

## 2023-08-02 NOTE — Progress Notes (Signed)
Pt presents for ROB visit. Declines Tdap.

## 2023-08-03 LAB — CBC
Hematocrit: 31.4 % — ABNORMAL LOW (ref 34.0–46.6)
Hemoglobin: 10 g/dL — ABNORMAL LOW (ref 11.1–15.9)
MCH: 28.6 pg (ref 26.6–33.0)
MCHC: 31.8 g/dL (ref 31.5–35.7)
MCV: 90 fL (ref 79–97)
Platelets: 201 10*3/uL (ref 150–450)
RBC: 3.5 x10E6/uL — ABNORMAL LOW (ref 3.77–5.28)
RDW: 12.5 % (ref 11.7–15.4)
WBC: 10.6 10*3/uL (ref 3.4–10.8)

## 2023-08-03 LAB — GLUCOSE TOLERANCE, 2 HOURS W/ 1HR
Glucose, 1 hour: 121 mg/dL (ref 70–179)
Glucose, 2 hour: 98 mg/dL (ref 70–152)
Glucose, Fasting: 121 mg/dL — ABNORMAL HIGH (ref 70–91)

## 2023-08-03 LAB — RPR: RPR Ser Ql: NONREACTIVE

## 2023-08-03 LAB — HIV ANTIBODY (ROUTINE TESTING W REFLEX): HIV Screen 4th Generation wRfx: NONREACTIVE

## 2023-08-04 DIAGNOSIS — O24420 Gestational diabetes mellitus in childbirth, diet controlled: Secondary | ICD-10-CM | POA: Insufficient documentation

## 2023-08-05 DIAGNOSIS — O99019 Anemia complicating pregnancy, unspecified trimester: Secondary | ICD-10-CM | POA: Insufficient documentation

## 2023-08-05 MED ORDER — FERROUS SULFATE 325 (65 FE) MG PO TBEC
325.0000 mg | DELAYED_RELEASE_TABLET | ORAL | 1 refills | Status: AC
Start: 2023-08-05 — End: ?

## 2023-08-05 NOTE — Addendum Note (Signed)
 Addended by: Gerrit Heck L on: 08/05/2023 05:15 AM   Modules accepted: Orders

## 2023-08-06 ENCOUNTER — Other Ambulatory Visit: Payer: Self-pay

## 2023-08-06 MED ORDER — ACCU-CHEK SOFTCLIX LANCETS MISC: 12 refills | Status: DC

## 2023-08-06 MED ORDER — ACCU-CHEK GUIDE TEST VI STRP: ORAL_STRIP | 12 refills | Status: DC

## 2023-08-06 MED ORDER — ACCU-CHEK GUIDE W/DEVICE KIT: 1.0000 | PACK | Freq: Four times a day (QID) | 0 refills | Status: DC

## 2023-08-06 NOTE — Progress Notes (Signed)
 RX for GDM supplies sent

## 2023-08-07 ENCOUNTER — Telehealth: Payer: Self-pay

## 2023-08-07 NOTE — Telephone Encounter (Signed)
 Pt was informed yesterday 4/7 of GDM results and supplies being sent in. Pt voiced understanding at that time and had no concerns. Pt called this AM 4/8 stating that she needs to retake GTT due to eating two slices of pizza before she came in for her GTT previously. Pt apologized for not informing us, and stated that she did not tell us because she did not think it would impact her gtt results. Message sent to schedulers for pt to repeat GTT. Pt knows to not eat anything after midnight before test.

## 2023-08-09 ENCOUNTER — Encounter: Payer: Self-pay | Admitting: Obstetrics

## 2023-08-09 ENCOUNTER — Other Ambulatory Visit: Payer: Self-pay

## 2023-08-09 ENCOUNTER — Other Ambulatory Visit: Payer: Self-pay | Admitting: *Deleted

## 2023-08-09 ENCOUNTER — Ambulatory Visit (HOSPITAL_BASED_OUTPATIENT_CLINIC_OR_DEPARTMENT_OTHER): Payer: MEDICAID | Admitting: Obstetrics

## 2023-08-09 ENCOUNTER — Ambulatory Visit: Payer: MEDICAID

## 2023-08-09 ENCOUNTER — Ambulatory Visit: Payer: MEDICAID | Attending: Obstetrics and Gynecology

## 2023-08-09 VITALS — BP 129/66 | HR 81

## 2023-08-09 DIAGNOSIS — O09523 Supervision of elderly multigravida, third trimester: Secondary | ICD-10-CM | POA: Diagnosis not present

## 2023-08-09 DIAGNOSIS — O99212 Obesity complicating pregnancy, second trimester: Secondary | ICD-10-CM | POA: Insufficient documentation

## 2023-08-09 DIAGNOSIS — Z3A28 28 weeks gestation of pregnancy: Secondary | ICD-10-CM

## 2023-08-09 DIAGNOSIS — O24420 Gestational diabetes mellitus in childbirth, diet controlled: Secondary | ICD-10-CM | POA: Diagnosis present

## 2023-08-09 DIAGNOSIS — O0992 Supervision of high risk pregnancy, unspecified, second trimester: Secondary | ICD-10-CM | POA: Diagnosis present

## 2023-08-09 DIAGNOSIS — Z8639 Personal history of other endocrine, nutritional and metabolic disease: Secondary | ICD-10-CM

## 2023-08-09 DIAGNOSIS — E669 Obesity, unspecified: Secondary | ICD-10-CM | POA: Diagnosis not present

## 2023-08-09 DIAGNOSIS — Z6841 Body Mass Index (BMI) 40.0 and over, adult: Secondary | ICD-10-CM | POA: Insufficient documentation

## 2023-08-09 DIAGNOSIS — O99213 Obesity complicating pregnancy, third trimester: Secondary | ICD-10-CM | POA: Diagnosis not present

## 2023-08-09 DIAGNOSIS — O24410 Gestational diabetes mellitus in pregnancy, diet controlled: Secondary | ICD-10-CM | POA: Diagnosis not present

## 2023-08-09 DIAGNOSIS — O09522 Supervision of elderly multigravida, second trimester: Secondary | ICD-10-CM | POA: Diagnosis present

## 2023-08-09 DIAGNOSIS — O3660X0 Maternal care for excessive fetal growth, unspecified trimester, not applicable or unspecified: Secondary | ICD-10-CM

## 2023-08-09 NOTE — Progress Notes (Signed)
 MFM Consult Note  Joanna Neal is currently at 28 weeks and 3 days.  She has been followed due to advanced maternal age (38 years old) and maternal obesity with a BMI of 40.7.    She was recently diagnosed with diet-controlled gestational diabetes.  However, she reports that she is retaking the screening test for gestational diabetes tomorrow as she ate prior to her last test.  The only elevated value from her first 2-hour glucose screening test was her fasting level.  On today's exam, the overall EFW of 3 pounds 2 ounces measures at the 79th percentile for her gestational age.  There was normal amniotic fluid noted.    Due to maternal obesity and her underlying medical conditions, we will start weekly fetal testing at 32 weeks.    She will return in 4 weeks for a growth scan and BPP.    The patient stated that all of her questions were answered today.  A total of 10 minutes was spent counseling and coordinating the care for this patient.  Greater than 50% of the time was spent in direct face-to-face contact.

## 2023-08-10 ENCOUNTER — Other Ambulatory Visit: Payer: MEDICAID

## 2023-08-10 DIAGNOSIS — Z3A28 28 weeks gestation of pregnancy: Secondary | ICD-10-CM

## 2023-08-15 LAB — GLUCOSE TOLERANCE, 2 HOURS W/ 1HR
Glucose, 1 hour: 144 mg/dL (ref 70–179)
Glucose, 2 hour: 84 mg/dL (ref 70–152)
Glucose, Fasting: 83 mg/dL (ref 70–91)

## 2023-08-15 LAB — HIV ANTIBODY (ROUTINE TESTING W REFLEX)

## 2023-08-15 LAB — CBC

## 2023-08-15 LAB — RPR

## 2023-08-15 NOTE — Progress Notes (Unsigned)
   PRENATAL VISIT NOTE  Subjective:  Joanna Neal is a 38 y.o. [phone] at [redacted]w[redacted]d being seen today for ongoing prenatal care.  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  Patient reports {sx:14538}.   .  .   . Denies leaking of fluid.   The following portions of the patient's history were reviewed and updated as appropriate: allergies, current medications, past family history, past medical history, past social history, past surgical history and problem list.   Objective:  There were no vitals filed for this visit.  Fetal Status:           General:  Alert, oriented and cooperative. Patient is in no acute distress.  Skin: Skin is warm and dry. No rash noted.   Cardiovascular: Normal heart rate noted  Respiratory: Normal respiratory effort, no problems with respiration noted  Abdomen: Soft, gravid, appropriate for gestational age.        Pelvic: {Blank single:19197::"Cervical exam performed in the presence of a chaperone","Cervical exam deferred"}        Extremities: Normal range of motion.     Mental Status: Normal mood and affect. Normal behavior. Normal judgment and thought content.   Assessment and Plan:  Pregnancy: G4P3003 at [redacted]w[redacted]d  1. Supervision of high risk pregnancy, antepartum, second trimester (Primary) Patient is doing well, feeling regular fetal movement BP, FHR, FH appropriate    2. [redacted] weeks gestation of pregnancy Anticipatory guidance about next visits/weeks of pregnancy given.   3. BMI 40.0-44.9, adult (HCC) Growth scan every 4w at 24w Weekly antenatal testing at 34w   GDM??***  {Blank single:19197::"Term","Preterm"} labor symptoms and general obstetric precautions including but not limited to vaginal bleeding, contractions, leaking of fluid and fetal movement were reviewed in detail with the patient. Please refer to After Visit Summary for other counseling recommendations.   No follow-ups on file.  Future Appointments  Date Time Provider Department Center  08/16/2023  9:35 AM Luevenia Saha, PA-C [facility] None  09/06/2023  7:00 AM [facility] PROVIDER 1 [facility]  09/06/2023  7:30 AM [facility] US5 [facility]  09/13/2023  7:00 AM [facility] PROVIDER 1 [facility]  09/13/2023  7:30 AM [facility] US5 [facility]  09/20/2023  7:00 AM [facility] PROVIDER 1 [facility]  09/20/2023  7:30 AM [facility]5 [facility]  09/27/2023  7:00 AM [facility] PROVIDER 1 [facility]  09/27/2023  7:30 AM [facility]5 [facility]    Luevenia Saha, PA-C

## 2023-08-16 ENCOUNTER — Ambulatory Visit: Payer: MEDICAID | Admitting: Physician Assistant

## 2023-08-16 ENCOUNTER — Encounter: Payer: Self-pay | Admitting: Advanced Practice Midwife

## 2023-08-16 VITALS — BP 116/78 | HR 80 | Wt 311.2 lb

## 2023-08-16 DIAGNOSIS — Z3A29 29 weeks gestation of pregnancy: Secondary | ICD-10-CM

## 2023-08-16 DIAGNOSIS — O26892 Other specified pregnancy related conditions, second trimester: Secondary | ICD-10-CM | POA: Diagnosis not present

## 2023-08-16 DIAGNOSIS — O0992 Supervision of high risk pregnancy, unspecified, second trimester: Secondary | ICD-10-CM

## 2023-08-16 DIAGNOSIS — Z6791 Unspecified blood type, Rh negative: Secondary | ICD-10-CM | POA: Diagnosis not present

## 2023-08-16 DIAGNOSIS — O360921 Maternal care for other rhesus isoimmunization, second trimester, fetus 1: Secondary | ICD-10-CM | POA: Diagnosis not present

## 2023-08-16 DIAGNOSIS — Z6841 Body Mass Index (BMI) 40.0 and over, adult: Secondary | ICD-10-CM | POA: Diagnosis not present

## 2023-08-16 DIAGNOSIS — O26899 Other specified pregnancy related conditions, unspecified trimester: Secondary | ICD-10-CM | POA: Insufficient documentation

## 2023-08-16 MED ORDER — RHO D IMMUNE GLOBULIN 1500 UNIT/2ML IJ SOSY
300.0000 ug | PREFILLED_SYRINGE | Freq: Once | INTRAMUSCULAR | Status: AC
  Administered 2023-08-16: 300 ug via INTRAMUSCULAR

## 2023-08-16 NOTE — Patient Instructions (Signed)
 You may take up to 300 mg Magnesium for improved sleep

## 2023-08-17 ENCOUNTER — Encounter: Payer: Self-pay | Admitting: Physician Assistant

## 2023-08-17 LAB — ANTIBODY SCREEN: Antibody Screen: NEGATIVE

## 2023-08-28 NOTE — Progress Notes (Unsigned)
   PRENATAL VISIT NOTE  Subjective:  Joanna Neal is a 38 y.o. [phone] at [redacted]w[redacted]d being seen today for ongoing prenatal care.  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  Patient reports {sx:14538}.   .  .   . Denies leaking of fluid.   The following portions of the patient's history were reviewed and updated as appropriate: allergies, current medications, past family history, past medical history, past social history, past surgical history and problem list.   Objective:  There were no vitals filed for this visit.  Fetal Status:           General:  Alert, oriented and cooperative. Patient is in no acute distress.  Skin: Skin is warm and dry. No rash noted.   Cardiovascular: Normal heart rate noted  Respiratory: Normal respiratory effort, no problems with respiration noted  Abdomen: Soft, gravid, appropriate for gestational age.        Pelvic: Cervical exam deferred        Extremities: Normal range of motion.     Mental Status: Normal mood and affect. Normal behavior. Normal judgment and thought content.   Assessment and Plan:  Pregnancy: G4P3003 at [redacted]w[redacted]d  1. Encounter for supervision of normal first pregnancy in third trimester (Primary) Patient is doing well, feeling regular fetal movement   2. [redacted] weeks gestation of pregnancy Anticipatory guidance about next visits/weeks of pregnancy given.   3. BMI  40.0-44.9, adult (HCC) Growth US  09/06/23 Weekly antenatal testing at 32 weeks  Preterm labor symptoms and general obstetric precautions including but not limited to vaginal bleeding, contractions, leaking of fluid and fetal movement were reviewed in detail with the patient. Please refer to After Visit Summary for other counseling recommendations.   No follow-ups on file.  Future Appointments  Date Time Provider Department Center  08/29/2023 10:55 AM Luevenia Saha, PA-C [facility] None  09/06/2023  7:00 AM [facility] PROVIDER 1 [facility]  09/06/2023  7:30 AM [facility] US5 [facility]  09/13/2023  7:00 AM [facility] PROVIDER 1 [facility]  09/13/2023  7:30 AM [facility] US[facility]  09/20/2023  7:00 AM [facility] PROVIDER 1 [facility]  09/20/2023  7:30 AM [facility]  09/27/2023  7:00 AM [facility] PROVIDER 1 [facility]  09/27/2023  7:30 AM [facility] US5 [facility]    Luevenia Saha, PA-C

## 2023-08-29 ENCOUNTER — Ambulatory Visit (INDEPENDENT_AMBULATORY_CARE_PROVIDER_SITE_OTHER): Payer: MEDICAID | Admitting: Physician Assistant

## 2023-08-29 VITALS — BP 121/71 | HR 83 | Wt 312.7 lb

## 2023-08-29 DIAGNOSIS — Z3A31 31 weeks gestation of pregnancy: Secondary | ICD-10-CM

## 2023-08-29 DIAGNOSIS — R1011 Right upper quadrant pain: Secondary | ICD-10-CM

## 2023-08-29 DIAGNOSIS — Z6841 Body Mass Index (BMI) 40.0 and over, adult: Secondary | ICD-10-CM

## 2023-08-29 DIAGNOSIS — Z3403 Encounter for supervision of normal first pregnancy, third trimester: Secondary | ICD-10-CM

## 2023-08-29 NOTE — Progress Notes (Signed)
 Has questions about her iron pills.

## 2023-08-30 ENCOUNTER — Encounter: Payer: MEDICAID | Admitting: Physician Assistant

## 2023-08-30 ENCOUNTER — Encounter: Payer: Self-pay | Admitting: Physician Assistant

## 2023-08-30 LAB — COMPREHENSIVE METABOLIC PANEL WITH GFR
ALT: 16 IU/L (ref 0–32)
AST: 13 IU/L (ref 0–40)
Albumin: 3.7 g/dL — ABNORMAL LOW (ref 3.9–4.9)
Alkaline Phosphatase: 100 IU/L (ref 44–121)
BUN/Creatinine Ratio: 12 (ref 9–23)
BUN: 9 mg/dL (ref 6–20)
Bilirubin Total: 0.3 mg/dL (ref 0.0–1.2)
CO2: 23 mmol/L (ref 20–29)
Calcium: 8.6 mg/dL — ABNORMAL LOW (ref 8.7–10.2)
Chloride: 102 mmol/L (ref 96–106)
Creatinine, Ser: 0.73 mg/dL (ref 0.57–1.00)
Globulin, Total: 2.2 g/dL (ref 1.5–4.5)
Glucose: 72 mg/dL (ref 70–99)
Potassium: 4.2 mmol/L (ref 3.5–5.2)
Sodium: 139 mmol/L (ref 134–144)
Total Protein: 5.9 g/dL — ABNORMAL LOW (ref 6.0–8.5)
eGFR: 110 mL/min/{1.73_m2} (ref >59)

## 2023-09-06 ENCOUNTER — Ambulatory Visit: Payer: MEDICAID

## 2023-09-06 ENCOUNTER — Ambulatory Visit: Payer: MEDICAID | Attending: Obstetrics | Admitting: Obstetrics

## 2023-09-06 VITALS — BP 124/65

## 2023-09-06 DIAGNOSIS — O09513 Supervision of elderly primigravida, third trimester: Secondary | ICD-10-CM | POA: Insufficient documentation

## 2023-09-06 DIAGNOSIS — O09523 Supervision of elderly multigravida, third trimester: Secondary | ICD-10-CM

## 2023-09-06 DIAGNOSIS — Z6841 Body Mass Index (BMI) 40.0 and over, adult: Secondary | ICD-10-CM

## 2023-09-06 DIAGNOSIS — O24410 Gestational diabetes mellitus in pregnancy, diet controlled: Secondary | ICD-10-CM

## 2023-09-06 DIAGNOSIS — Z148 Genetic carrier of other disease: Secondary | ICD-10-CM | POA: Insufficient documentation

## 2023-09-06 DIAGNOSIS — O3663X0 Maternal care for excessive fetal growth, third trimester, not applicable or unspecified: Secondary | ICD-10-CM | POA: Insufficient documentation

## 2023-09-06 DIAGNOSIS — O24420 Gestational diabetes mellitus in childbirth, diet controlled: Secondary | ICD-10-CM

## 2023-09-06 DIAGNOSIS — Z3A32 32 weeks gestation of pregnancy: Secondary | ICD-10-CM

## 2023-09-06 DIAGNOSIS — O99340 Other mental disorders complicating pregnancy, unspecified trimester: Secondary | ICD-10-CM | POA: Insufficient documentation

## 2023-09-06 DIAGNOSIS — O99213 Obesity complicating pregnancy, third trimester: Secondary | ICD-10-CM | POA: Diagnosis not present

## 2023-09-06 DIAGNOSIS — F319 Bipolar disorder, unspecified: Secondary | ICD-10-CM | POA: Insufficient documentation

## 2023-09-06 DIAGNOSIS — E669 Obesity, unspecified: Secondary | ICD-10-CM | POA: Diagnosis not present

## 2023-09-06 DIAGNOSIS — Z362 Encounter for other antenatal screening follow-up: Secondary | ICD-10-CM | POA: Diagnosis not present

## 2023-09-06 DIAGNOSIS — Z8639 Personal history of other endocrine, nutritional and metabolic disease: Secondary | ICD-10-CM

## 2023-09-06 DIAGNOSIS — O24419 Gestational diabetes mellitus in pregnancy, unspecified control: Secondary | ICD-10-CM | POA: Insufficient documentation

## 2023-09-06 DIAGNOSIS — O0992 Supervision of high risk pregnancy, unspecified, second trimester: Secondary | ICD-10-CM

## 2023-09-06 NOTE — Progress Notes (Signed)
 MFM Note  Joanna Neal is currently at 32 weeks and 3 days.  She has been followed due to advanced maternal age (38 years old), diet-controlled gestational diabetes, and maternal obesity with a BMI of 40.7.    On today's exam, the overall EFW of 4 pounds 13 ounces measures at the 72nd percentile for her gestational age.  There was normal amniotic fluid noted.    A BPP performed today was 8 out of 8.    Due to her underlying medical conditions, we will continue to follow her with weekly fetal testing until delivery.     She will return in 1 week for another BPP.

## 2023-09-09 NOTE — Progress Notes (Unsigned)
   PRENATAL VISIT NOTE  Subjective:  Joanna Neal is a 38 y.o. [phone] at [redacted]w[redacted]d being seen today for ongoing prenatal care.  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  Patient reports {sx:14538}.   .  .   . Denies leaking of fluid.   The following portions of the patient's history were reviewed and updated as appropriate: allergies, current medications, past family history, past medical history, past social history, past surgical history and problem list.   Objective:  There were no vitals filed for this visit.   Fetal Status:            General: Alert, oriented and cooperative. Patient is in no acute distress.  Skin: Skin is warm and dry. No rash noted.   Cardiovascular: Normal heart rate noted  Respiratory: Normal respiratory effort, no problems with respiration noted  Abdomen: Soft, gravid, appropriate for gestational age.        Pelvic: Cervical exam deferred        Extremities: Normal range of motion.     Mental Status: Normal mood and affect. Normal behavior. Normal judgment and thought content.   Assessment and Plan:  Pregnancy: G4P3003 at [redacted]w[redacted]d  1. Encounter for supervision of normal first pregnancy in third trimester (Primary) Patient doing well, feeling regular fetal movement BP, FHR, FH appropriate  2. [redacted] weeks gestation of pregnancy Anticipatory guidance about next visits/weeks of pregnancy  given.   3. BMI 40.0-44.9, adult (HCC) Last growth 09/06/2023, EFW 72%, AFI WNL Continue growth scan every 4w Continue weekly antenatal testing   Preterm labor symptoms and general obstetric precautions including but not limited to vaginal bleeding, contractions, leaking of fluid and fetal movement were reviewed in detail with the patient.  Please refer to After Visit Summary for other counseling recommendations.   No follow-ups on file.  Future Appointments  Date Time Provider Department Center  09/12/2023  8:35 AM Donnarae Rae E, PA-C [facility] None  09/13/2023  7:00 AM [facility] PROVIDER 1 [facility]  09/13/2023  7:30 AM [facility] US5 [facility]  09/20/2023  7:00 AM [facility] PROVIDER 1 [facility]  09/20/2023  7:30 AM [facility] US5 [facility]  09/27/2023  7:00 AM [facility] PROVIDER 1 [facility]  09/27/2023  7:30 AM [facility]5 [facility]    Luevenia Saha, PA-C

## 2023-09-12 ENCOUNTER — Ambulatory Visit: Payer: MEDICAID | Admitting: Physician Assistant

## 2023-09-12 VITALS — BP 120/79 | HR 86 | Wt 322.9 lb

## 2023-09-12 DIAGNOSIS — O0992 Supervision of high risk pregnancy, unspecified, second trimester: Secondary | ICD-10-CM

## 2023-09-12 DIAGNOSIS — Z8639 Personal history of other endocrine, nutritional and metabolic disease: Secondary | ICD-10-CM | POA: Diagnosis not present

## 2023-09-12 DIAGNOSIS — Z3A33 33 weeks gestation of pregnancy: Secondary | ICD-10-CM

## 2023-09-12 DIAGNOSIS — O26893 Other specified pregnancy related conditions, third trimester: Secondary | ICD-10-CM

## 2023-09-12 DIAGNOSIS — Z6841 Body Mass Index (BMI) 40.0 and over, adult: Secondary | ICD-10-CM

## 2023-09-12 DIAGNOSIS — Z3403 Encounter for supervision of normal first pregnancy, third trimester: Secondary | ICD-10-CM | POA: Diagnosis not present

## 2023-09-12 DIAGNOSIS — R102 Pelvic and perineal pain: Secondary | ICD-10-CM

## 2023-09-12 DIAGNOSIS — F32A Depression, unspecified: Secondary | ICD-10-CM

## 2023-09-12 NOTE — Progress Notes (Signed)
 Pt presents for ROB. Having pelvic pain. Still needs to get iron pills OTC. Pt provided Healthcare Provider Accommodation Certification form. No other questions or concerns.

## 2023-09-13 ENCOUNTER — Ambulatory Visit: Payer: MEDICAID

## 2023-09-20 ENCOUNTER — Other Ambulatory Visit: Payer: Self-pay

## 2023-09-20 ENCOUNTER — Ambulatory Visit: Payer: MEDICAID | Attending: Obstetrics

## 2023-09-20 ENCOUNTER — Ambulatory Visit (HOSPITAL_BASED_OUTPATIENT_CLINIC_OR_DEPARTMENT_OTHER): Payer: MEDICAID | Admitting: Obstetrics

## 2023-09-20 VITALS — BP 106/67 | HR 80

## 2023-09-20 DIAGNOSIS — O99213 Obesity complicating pregnancy, third trimester: Secondary | ICD-10-CM | POA: Diagnosis not present

## 2023-09-20 DIAGNOSIS — Z8639 Personal history of other endocrine, nutritional and metabolic disease: Secondary | ICD-10-CM | POA: Insufficient documentation

## 2023-09-20 DIAGNOSIS — O09513 Supervision of elderly primigravida, third trimester: Secondary | ICD-10-CM

## 2023-09-20 DIAGNOSIS — O09523 Supervision of elderly multigravida, third trimester: Secondary | ICD-10-CM | POA: Insufficient documentation

## 2023-09-20 DIAGNOSIS — Z148 Genetic carrier of other disease: Secondary | ICD-10-CM

## 2023-09-20 DIAGNOSIS — Z3A34 34 weeks gestation of pregnancy: Secondary | ICD-10-CM

## 2023-09-20 DIAGNOSIS — O24420 Gestational diabetes mellitus in childbirth, diet controlled: Secondary | ICD-10-CM

## 2023-09-20 DIAGNOSIS — O0992 Supervision of high risk pregnancy, unspecified, second trimester: Secondary | ICD-10-CM

## 2023-09-20 DIAGNOSIS — O99343 Other mental disorders complicating pregnancy, third trimester: Secondary | ICD-10-CM | POA: Diagnosis not present

## 2023-09-20 DIAGNOSIS — F319 Bipolar disorder, unspecified: Secondary | ICD-10-CM

## 2023-09-20 DIAGNOSIS — Z6841 Body Mass Index (BMI) 40.0 and over, adult: Secondary | ICD-10-CM

## 2023-09-20 NOTE — Progress Notes (Signed)
 MFM Note  Joanna Neal is currently at 34 weeks and 3 days.  She has been followed due to advanced maternal age (38 years old) and maternal obesity with a BMI of 40.7.    There was normal amniotic fluid noted with a total AFI of 18.2 cm.   A BPP performed today was 8 out of 8.    Due to her underlying medical conditions, we will continue to follow her with weekly fetal testing until delivery.     She will return in 1 week for an NST.

## 2023-09-26 ENCOUNTER — Ambulatory Visit: Payer: MEDICAID | Admitting: Obstetrics and Gynecology

## 2023-09-26 VITALS — BP 113/69 | HR 92 | Wt 324.6 lb

## 2023-09-26 DIAGNOSIS — Z3403 Encounter for supervision of normal first pregnancy, third trimester: Secondary | ICD-10-CM

## 2023-09-26 DIAGNOSIS — Z8639 Personal history of other endocrine, nutritional and metabolic disease: Secondary | ICD-10-CM

## 2023-09-26 DIAGNOSIS — O09523 Supervision of elderly multigravida, third trimester: Secondary | ICD-10-CM | POA: Diagnosis not present

## 2023-09-26 DIAGNOSIS — Z3A35 35 weeks gestation of pregnancy: Secondary | ICD-10-CM

## 2023-09-26 DIAGNOSIS — Z6841 Body Mass Index (BMI) 40.0 and over, adult: Secondary | ICD-10-CM | POA: Diagnosis not present

## 2023-09-26 DIAGNOSIS — O99019 Anemia complicating pregnancy, unspecified trimester: Secondary | ICD-10-CM

## 2023-09-26 DIAGNOSIS — Z6791 Unspecified blood type, Rh negative: Secondary | ICD-10-CM

## 2023-09-26 DIAGNOSIS — O26899 Other specified pregnancy related conditions, unspecified trimester: Secondary | ICD-10-CM

## 2023-09-26 NOTE — Progress Notes (Signed)
 Pt presents for rob. Pt has no questions or concerns at this time.

## 2023-09-26 NOTE — Progress Notes (Signed)
   PRENATAL VISIT NOTE  Subjective:  Joanna Neal is a 38 y.o. [phone] at [redacted]w[redacted]d being seen today for ongoing prenatal care.  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  Patient reports irregular cramping .  Contractions: Irritability. Vag. Bleeding: None.  Movement: Present. Denies leaking of fluid.   The following portions of the patient's history were reviewed and updated as appropriate: allergies, current medications, past family history, past medical history, past social history, past surgical history and problem list.   Objective:    Vitals:   09/26/23 0824  BP: 113/69  Pulse: 92  Weight: (!) 324 lb 9.6 oz (147.2 kg)    Fetal Status:  Fetal Heart Rate (bpm): 158   Movement: Present    General: Alert, oriented and cooperative. Patient is in no acute distress.  Skin: Skin is warm and dry. No rash noted.   Cardiovascular: Normal heart rate noted  Respiratory: Normal respiratory effort, no problems with respiration noted  Abdomen: Soft, gravid, appropriate for gestational age.  Pain/Pressure: Present (Pelvic)     Pelvic: Cervical exam deferred        Extremities: Normal range of motion.  Edema: Trace (Feet)  Mental Status: Normal mood and affect. Normal behavior. Normal judgment and thought content.   Assessment and Plan:  Pregnancy: G4P3003 at 110w2d 1. Encounter for supervision of normal first pregnancy in third trimester (Primary) BP and  FHR normal Doing well, feeling regular movement    2. [redacted] weeks gestation of pregnancy Anticipatory guidance regarding swabs next visit  3. Multigravida of advanced maternal age in third trimester 4. BMI 40.0-44.9, adult (HCC) 5/8 /s EFW  72%, afi normal, BPP 8/8 Continue weekly testing and growth scans Continue ASA  5. History of diabetes mellitus-not with pregnancy-was on Metformin-d/ced 3-4 years ago Normal A1c and GTT  6. Antepartum anemia Continue oral iron   7. RH negative status antepartum S/p rhogam   Preterm labor symptoms and general obstetric precautions including but not limited to vaginal bleeding, contractions, leaking of fluid and fetal movement were reviewed in detail with the patient. Please refer to After Visit Summary for other counseling recommendations.   Return in about 1 week (around 10/03/2023) for OB VISIT (MD or APP).  Future Appointments  Date Time Provider Department Center  09/27/2023  8:30 AM [facility] NURSE [facility]  09/27/2023  8:45 AM [facility] NST [facility]    Susi Eric, FNP

## 2023-09-27 ENCOUNTER — Other Ambulatory Visit: Payer: MEDICAID

## 2023-09-27 ENCOUNTER — Ambulatory Visit: Payer: MEDICAID

## 2023-10-04 ENCOUNTER — Encounter: Payer: Self-pay | Admitting: Obstetrics and Gynecology

## 2023-10-04 ENCOUNTER — Other Ambulatory Visit (HOSPITAL_COMMUNITY)
Admission: RE | Admit: 2023-10-04 | Discharge: 2023-10-04 | Disposition: A | Discharge status: Home / Self Care | Payer: MEDICAID | Source: Ambulatory Visit | Attending: Obstetrics and Gynecology | Admitting: Obstetrics and Gynecology

## 2023-10-04 ENCOUNTER — Ambulatory Visit (INDEPENDENT_AMBULATORY_CARE_PROVIDER_SITE_OTHER): Payer: MEDICAID | Admitting: Obstetrics and Gynecology

## 2023-10-04 VITALS — BP 125/71 | HR 86 | Wt 326.0 lb

## 2023-10-04 DIAGNOSIS — R519 Headache, unspecified: Secondary | ICD-10-CM

## 2023-10-04 DIAGNOSIS — O26893 Other specified pregnancy related conditions, third trimester: Secondary | ICD-10-CM

## 2023-10-04 DIAGNOSIS — O0992 Supervision of high risk pregnancy, unspecified, second trimester: Secondary | ICD-10-CM

## 2023-10-04 DIAGNOSIS — O99213 Obesity complicating pregnancy, third trimester: Secondary | ICD-10-CM | POA: Diagnosis not present

## 2023-10-04 DIAGNOSIS — Z3A36 36 weeks gestation of pregnancy: Secondary | ICD-10-CM | POA: Diagnosis not present

## 2023-10-04 NOTE — Patient Instructions (Signed)
    [facility]     [facility]                    [facility]                 [facility]    **Purchase ONE of these store-brand Excedrin Tension Headache medications. Take it as directed for relief of headache. DO NOT TAKE AT THE SAME TIME AS TYLENOL /ACETAMINOPHEN **

## 2023-10-04 NOTE — Progress Notes (Signed)
  HIGH-RISK PREGNANCY OFFICE VISIT Patient name: Joanna Neal MRN [id number]  Date of birth: Jul 17, 1985 Chief Complaint:   Routine Prenatal Visit  History of Present Illness:   Joanna Neal is a 38 y.o. G9P3003 female at [redacted]w[redacted]d with an Estimated Date of Delivery: 10/29/23 being seen today for ongoing management of a high-risk pregnancy complicated by obesity and Rh Negative Today she reports occasional contractions and and increased pelvic pressure. Contractions: Irritability. Vag. Bleeding: None.  Movement: Present. denies leaking of fluid.  Review of Systems:   Pertinent items are noted in HPI Denies abnormal vaginal discharge w/ itching/odor/irritation, headaches, visual changes, shortness of breath, chest pain, abdominal pain, severe nausea/vomiting, or problems with urination or bowel movements unless otherwise stated above. Pertinent History Reviewed:  Reviewed past medical,surgical, social, obstetrical and family history.  Reviewed problem list, medications and allergies. Physical Assessment:   Vitals:   10/04/23 0839  BP: 125/71  Pulse: 86  Weight: (!) 326 lb (147.9 kg)  Body mass index is 51.06 kg/m.           Physical Examination:   General appearance: alert, well appearing, and in no distress, oriented to person, place, and time, and overweight  Mental status: alert, oriented to person, place, and time, normal mood, behavior, speech, dress, motor activity, and thought processes  Skin: warm & dry   Extremities: Edema: Trace    Cardiovascular: normal heart rate noted  Respiratory: normal respiratory effort, no distress  Abdomen: gravid, soft, non-tender  Pelvic: Cervical exam performed  Dilation: Fingertip Effacement (%): Thick Station: Ballotable  Fetal Status: Fetal Heart Rate (bpm): 155 Fundal Height: 40 cm Movement: Present Presentation: Undeterminable  Fetal Surveillance Testing today: NST REACTIVE NST - FHR: 155 bpm / moderate variability / accels present /  decels absent / TOCO: irritability    No results found for this or any previous visit (from the past 24 hours).  Assessment & Plan:  1) High-risk pregnancy G4P3003 at [redacted]w[redacted]d with an Estimated Date of Delivery: 10/29/23   2) Supervision of high risk pregnancy, antepartum, second trimester (Primary) - Culture, beta strep (group b only) - Cervicovaginal ancillary only( )  3) Headache in pregnancy, antepartum, third trimester - CBC - Comp Met (CMET) - Protein / creatinine ratio, urine - Reassurance given that BP is WNL - Instructions given to take Excedrin tension H/A 2 caplets every 8 hrs prn H/A. - If H/A unrelieved by Excedrin Tension H/A, go to [facility]U for evaluation  4) Obesity affecting pregnancy in third trimester, unspecified obesity type - Daily bASA  5) [redacted] weeks gestation of pregnancy    Meds: No orders of the defined types were placed in this encounter.   Labs/procedures today: GBS, GC/CT and PEC  Treatment Plan:  await PEC lab results  Reviewed: Preterm labor symptoms and general obstetric precautions including but not limited to vaginal bleeding, contractions, leaking of fluid and fetal movement were reviewed in detail with the patient.  All questions were answered. Has home bp cuff. Check bp weekly, let us  know if >140/90.   Follow-up: Return in about 1 week (around 10/11/2023) for Return OB visit.  Orders Placed This Encounter  Procedures   Culture, beta strep (group b only)   CBC   Comp Met (CMET)   Protein / creatinine ratio, urine   Almond Army MSN, CNM 10/04/2023 9:22 AM

## 2023-10-04 NOTE — Progress Notes (Signed)
 Pt presents for ROB visit. Pt c/o headaches every day for 2 weeks, vaginal odor, abd cramping

## 2023-10-05 LAB — CERVICOVAGINAL ANCILLARY ONLY
Bacterial Vaginitis (gardnerella): NEGATIVE
Candida Glabrata: NEGATIVE
Candida Vaginitis: NEGATIVE
Chlamydia: NEGATIVE
Comment: NEGATIVE
Comment: NEGATIVE
Comment: NEGATIVE
Comment: NEGATIVE
Comment: NEGATIVE
Comment: NORMAL
Neisseria Gonorrhea: NEGATIVE
Trichomonas: NEGATIVE

## 2023-10-06 ENCOUNTER — Ambulatory Visit: Payer: Self-pay | Admitting: Obstetrics and Gynecology

## 2023-10-07 LAB — CULTURE, BETA STREP (GROUP B ONLY): Strep Gp B Culture: POSITIVE — AB

## 2023-10-09 LAB — CBC
Hematocrit: 30.8 % — ABNORMAL LOW (ref 34.0–46.6)
Hemoglobin: 9.6 g/dL — ABNORMAL LOW (ref 11.1–15.9)
MCH: 26.3 pg — ABNORMAL LOW (ref 26.6–33.0)
MCHC: 31.2 g/dL — ABNORMAL LOW (ref 31.5–35.7)
MCV: 84 fL (ref 79–97)
Platelets: 194 10*3/uL (ref 150–450)
RBC: 3.65 x10E6/uL — ABNORMAL LOW (ref 3.77–5.28)
RDW: 13.7 % (ref 11.7–15.4)
WBC: 10.7 10*3/uL (ref 3.4–10.8)

## 2023-10-09 LAB — COMPREHENSIVE METABOLIC PANEL WITH GFR
ALT: 18 IU/L (ref 0–32)
AST: 16 IU/L (ref 0–40)
Albumin: 3.4 g/dL — ABNORMAL LOW (ref 3.9–4.9)
Alkaline Phosphatase: 133 IU/L — ABNORMAL HIGH (ref 44–121)
BUN/Creatinine Ratio: 9 (ref 9–23)
BUN: 6 mg/dL (ref 6–20)
Bilirubin Total: 0.3 mg/dL (ref 0.0–1.2)
CO2: 20 mmol/L (ref 20–29)
Calcium: 8.6 mg/dL — ABNORMAL LOW (ref 8.7–10.2)
Chloride: 103 mmol/L (ref 96–106)
Creatinine, Ser: 0.66 mg/dL (ref 0.57–1.00)
Globulin, Total: 2.3 g/dL (ref 1.5–4.5)
Glucose: 87 mg/dL (ref 70–99)
Potassium: 4.3 mmol/L (ref 3.5–5.2)
Sodium: 136 mmol/L (ref 134–144)
Total Protein: 5.7 g/dL — ABNORMAL LOW (ref 6.0–8.5)
eGFR: 117 mL/min/{1.73_m2} (ref >59)

## 2023-10-10 ENCOUNTER — Encounter: Payer: Self-pay | Admitting: Obstetrics and Gynecology

## 2023-10-10 ENCOUNTER — Other Ambulatory Visit: Payer: Self-pay | Admitting: Obstetrics and Gynecology

## 2023-10-10 ENCOUNTER — Encounter: Payer: Self-pay | Admitting: Physician Assistant

## 2023-10-10 MED ORDER — CYCLOBENZAPRINE HCL 10 MG PO TABS: 10.0000 mg | ORAL_TABLET | Freq: Three times a day (TID) | ORAL | 0 refills | Status: DC | PRN

## 2023-10-11 ENCOUNTER — Encounter: Payer: Self-pay | Admitting: Obstetrics

## 2023-10-11 ENCOUNTER — Ambulatory Visit: Payer: MEDICAID | Admitting: Obstetrics

## 2023-10-11 VITALS — BP 111/71 | HR 85 | Wt 324.0 lb

## 2023-10-11 DIAGNOSIS — O99213 Obesity complicating pregnancy, third trimester: Secondary | ICD-10-CM

## 2023-10-11 DIAGNOSIS — O09523 Supervision of elderly multigravida, third trimester: Secondary | ICD-10-CM

## 2023-10-11 DIAGNOSIS — Z3A37 37 weeks gestation of pregnancy: Secondary | ICD-10-CM

## 2023-10-11 DIAGNOSIS — O26893 Other specified pregnancy related conditions, third trimester: Secondary | ICD-10-CM

## 2023-10-11 DIAGNOSIS — O0992 Supervision of high risk pregnancy, unspecified, second trimester: Secondary | ICD-10-CM

## 2023-10-11 DIAGNOSIS — O0993 Supervision of high risk pregnancy, unspecified, third trimester: Secondary | ICD-10-CM

## 2023-10-11 DIAGNOSIS — Z8639 Personal history of other endocrine, nutritional and metabolic disease: Secondary | ICD-10-CM

## 2023-10-11 DIAGNOSIS — Z6791 Unspecified blood type, Rh negative: Secondary | ICD-10-CM

## 2023-10-11 NOTE — Progress Notes (Signed)
 Subjective:  Joanna Neal is a 38 y.o. [phone] at [redacted]w[redacted]d being seen today for ongoing prenatal care.  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  Patient reports headaches and occasional contractions.  Contractions: Irregular. Vag. Bleeding: None.  Movement: Present.but decreased.  Denies leaking of fluid.   The following portions of the patient's history were reviewed and updated as appropriate: allergies, current medications, past family history, past medical history, past social history, past surgical history and problem list. Problem list updated.  Objective:   Vitals:   10/11/23 0900  BP: 111/71  Pulse: 85  Weight: (!) 324 lb (147 kg)    Fetal Status: Fetal Heart Rate (bpm): 150   Movement: Present     General:  Alert, oriented and cooperative. Patient is in no acute distress.  Skin: Skin is warm and dry. No rash noted.   Cardiovascular: Normal heart rate noted  Respiratory: Normal respiratory effort, no problems with respiration noted  Abdomen: Soft, gravid, appropriate for gestational age. Pain/Pressure: Present     Pelvic:  Cervical exam deferred        Extremities: Normal range of motion.  Edema: Trace  Mental Status: Normal mood and affect. Normal behavior. Normal judgment and thought content.   Urinalysis:      Assessment and Plan:  Pregnancy: G4P3003 at [redacted]w[redacted]d  1. Supervision of high risk pregnancy, antepartum, second trimester  (Primary)  2. Multigravida of advanced maternal age in third trimester  3. Obesity affecting pregnancy in third trimester, unspecified obesity type  4. Rh negative state in antepartum period - Rhogam postpartum  5. History of diabetes mellitus-not with pregnancy-was on Metformin-d/ced 3-4 years ago    Term labor symptoms and general obstetric precautions including but not limited to vaginal bleeding, contractions, leaking of fluid and fetal movement were reviewed in detail with the patient. Please refer to After Visit Summary for other counseling recommendations.   Return in about 1 week (around 10/18/2023) for [facility].   Gabrielle Joiner, MD 10/11/2023

## 2023-10-11 NOTE — Progress Notes (Signed)
 Having headaches daily even with meds. Increased pelvic pressure at times. Lack of appetite x 2 weeks.

## 2023-10-17 ENCOUNTER — Ambulatory Visit: Payer: MEDICAID | Attending: Obstetrics and Gynecology | Admitting: Obstetrics and Gynecology

## 2023-10-17 ENCOUNTER — Ambulatory Visit: Payer: MEDICAID

## 2023-10-17 ENCOUNTER — Other Ambulatory Visit: Payer: Self-pay | Admitting: Obstetrics

## 2023-10-17 VITALS — BP 116/66

## 2023-10-17 DIAGNOSIS — Z6841 Body Mass Index (BMI) 40.0 and over, adult: Secondary | ICD-10-CM

## 2023-10-17 DIAGNOSIS — O0992 Supervision of high risk pregnancy, unspecified, second trimester: Secondary | ICD-10-CM

## 2023-10-17 DIAGNOSIS — Z3A38 38 weeks gestation of pregnancy: Secondary | ICD-10-CM

## 2023-10-17 DIAGNOSIS — E669 Obesity, unspecified: Secondary | ICD-10-CM | POA: Diagnosis not present

## 2023-10-17 DIAGNOSIS — O09523 Supervision of elderly multigravida, third trimester: Secondary | ICD-10-CM

## 2023-10-17 DIAGNOSIS — O99213 Obesity complicating pregnancy, third trimester: Secondary | ICD-10-CM

## 2023-10-17 DIAGNOSIS — F319 Bipolar disorder, unspecified: Secondary | ICD-10-CM | POA: Insufficient documentation

## 2023-10-17 DIAGNOSIS — O99343 Other mental disorders complicating pregnancy, third trimester: Secondary | ICD-10-CM | POA: Insufficient documentation

## 2023-10-17 DIAGNOSIS — Z148 Genetic carrier of other disease: Secondary | ICD-10-CM

## 2023-10-17 DIAGNOSIS — Z8639 Personal history of other endocrine, nutritional and metabolic disease: Secondary | ICD-10-CM

## 2023-10-17 NOTE — Progress Notes (Signed)
 After review, MFM consult with provider is not indicated for today  Cassandria Clever, MD 10/17/2023 8:25 AM  [facility]

## 2023-10-19 ENCOUNTER — Ambulatory Visit: Payer: MEDICAID | Admitting: Physician Assistant

## 2023-10-19 ENCOUNTER — Encounter (HOSPITAL_COMMUNITY): Payer: Self-pay | Admitting: *Deleted

## 2023-10-19 ENCOUNTER — Telehealth (HOSPITAL_COMMUNITY): Payer: Self-pay | Admitting: *Deleted

## 2023-10-19 VITALS — BP 118/76 | HR 85 | Wt 328.0 lb

## 2023-10-19 DIAGNOSIS — O0993 Supervision of high risk pregnancy, unspecified, third trimester: Secondary | ICD-10-CM | POA: Diagnosis not present

## 2023-10-19 DIAGNOSIS — Z3A38 38 weeks gestation of pregnancy: Secondary | ICD-10-CM

## 2023-10-19 DIAGNOSIS — O0992 Supervision of high risk pregnancy, unspecified, second trimester: Secondary | ICD-10-CM

## 2023-10-19 NOTE — Progress Notes (Signed)
   PRENATAL VISIT NOTE  Subjective:  Joanna Neal is a 38 y.o. [phone] at [redacted]w[redacted]d being seen today for ongoing prenatal care.  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  Patient reports no complaints.  Contractions: Irregular. Vag. Bleeding: None.  Movement: Present. Denies leaking of fluid.   The following portions of the patient's history were reviewed and updated as appropriate: allergies, current medications, past family history, past medical history, past social history, past surgical history and problem list.   Objective:    Vitals:   10/19/23 1003  BP: 118/76  Pulse: 85  Weight: (!) 328 lb (148.8 kg)    Fetal Status:  Fetal Heart Rate (bpm): 154 Fundal Height: 37 cm Movement: Present    General: Alert, oriented and cooperative. Patient is in no acute distress.  Skin: Skin is warm and dry. No rash noted.   Cardiovascular: Normal heart rate noted  Respiratory: Normal respiratory effort, no problems with respiration noted  Abdomen: Soft, gravid, appropriate for gestational age.  Pain/Pressure: Present     Pelvic: Cervical exam deferred        Extremities: Normal range of motion.  Edema: Trace  Mental Status: Normal mood and affect. Normal behavior. Normal judgment and thought content.   Assessment and Plan:  Pregnancy: G4P3003 at [redacted]w[redacted]d  1. Supervision of high risk pregnancy, antepartum, second trimester (Primary) Patient doing well,  feeling regular fetal movement  BP, FHR, FH appropriate   2. [redacted] weeks gestation of pregnancy Anticipatory guidance about next visits/weeks of pregnancy given.  IOL 10/22/23, per MFM. Earliest date available 6/24.   Term labor symptoms and general obstetric precautions including but not limited to vaginal bleeding, contractions, leaking of fluid and fetal movement were reviewed in detail with the patient.  Please refer to After Visit Summary for other counseling recommendations.   Return in about 4 weeks (around 11/16/2023) for PP.  Future Appointments  Date Time Provider Department Center  10/23/2023  6:45 AM [facility] SCHED ROOM [facility] None  11/16/2023 10:35 AM Gabrielle Joiner, MD [facility] None     Luevenia Saha, PA-C

## 2023-10-19 NOTE — Telephone Encounter (Signed)
 Preadmission screen

## 2023-10-19 NOTE — Progress Notes (Signed)
 Pt with headache and no other concerns.

## 2023-10-23 ENCOUNTER — Inpatient Hospital Stay (HOSPITAL_COMMUNITY)
Admission: RE | Admit: 2023-10-23 | Discharge: 2023-10-26 | DRG: 807 | Disposition: A | Discharge status: Home / Self Care | Payer: MEDICAID | Attending: Obstetrics and Gynecology | Admitting: Obstetrics and Gynecology

## 2023-10-23 ENCOUNTER — Other Ambulatory Visit: Payer: Self-pay

## 2023-10-23 ENCOUNTER — Inpatient Hospital Stay (HOSPITAL_COMMUNITY): Payer: MEDICAID

## 2023-10-23 DIAGNOSIS — E66813 Obesity, class 3: Secondary | ICD-10-CM | POA: Diagnosis present

## 2023-10-23 DIAGNOSIS — O3663X0 Maternal care for excessive fetal growth, third trimester, not applicable or unspecified: Secondary | ICD-10-CM | POA: Diagnosis present

## 2023-10-23 DIAGNOSIS — Z3A39 39 weeks gestation of pregnancy: Secondary | ICD-10-CM | POA: Diagnosis not present

## 2023-10-23 DIAGNOSIS — O99214 Obesity complicating childbirth: Secondary | ICD-10-CM | POA: Diagnosis present

## 2023-10-23 DIAGNOSIS — K219 Gastro-esophageal reflux disease without esophagitis: Secondary | ICD-10-CM | POA: Diagnosis present

## 2023-10-23 DIAGNOSIS — Z87891 Personal history of nicotine dependence: Secondary | ICD-10-CM | POA: Diagnosis not present

## 2023-10-23 DIAGNOSIS — O9962 Diseases of the digestive system complicating childbirth: Secondary | ICD-10-CM | POA: Diagnosis present

## 2023-10-23 DIAGNOSIS — O24410 Gestational diabetes mellitus in pregnancy, diet controlled: Principal | ICD-10-CM | POA: Diagnosis present

## 2023-10-23 DIAGNOSIS — O99824 Streptococcus B carrier state complicating childbirth: Secondary | ICD-10-CM | POA: Diagnosis present

## 2023-10-23 DIAGNOSIS — O26899 Other specified pregnancy related conditions, unspecified trimester: Secondary | ICD-10-CM

## 2023-10-23 DIAGNOSIS — O0992 Supervision of high risk pregnancy, unspecified, second trimester: Principal | ICD-10-CM

## 2023-10-23 DIAGNOSIS — Z148 Genetic carrier of other disease: Secondary | ICD-10-CM | POA: Diagnosis not present

## 2023-10-23 DIAGNOSIS — Z30017 Encounter for initial prescription of implantable subdermal contraceptive: Secondary | ICD-10-CM | POA: Diagnosis not present

## 2023-10-23 DIAGNOSIS — Z6791 Unspecified blood type, Rh negative: Secondary | ICD-10-CM | POA: Diagnosis not present

## 2023-10-23 DIAGNOSIS — O24420 Gestational diabetes mellitus in childbirth, diet controlled: Secondary | ICD-10-CM | POA: Diagnosis present

## 2023-10-23 DIAGNOSIS — O26893 Other specified pregnancy related conditions, third trimester: Secondary | ICD-10-CM | POA: Diagnosis present

## 2023-10-23 DIAGNOSIS — O09899 Supervision of other high risk pregnancies, unspecified trimester: Secondary | ICD-10-CM

## 2023-10-23 DIAGNOSIS — F3163 Bipolar disorder, current episode mixed, severe, without psychotic features: Secondary | ICD-10-CM | POA: Diagnosis present

## 2023-10-23 DIAGNOSIS — Z6841 Body Mass Index (BMI) 40.0 and over, adult: Secondary | ICD-10-CM

## 2023-10-23 DIAGNOSIS — Z8249 Family history of ischemic heart disease and other diseases of the circulatory system: Secondary | ICD-10-CM

## 2023-10-23 DIAGNOSIS — Z3480 Encounter for supervision of other normal pregnancy, unspecified trimester: Secondary | ICD-10-CM

## 2023-10-23 LAB — CBC
HCT: 32.5 % — ABNORMAL LOW (ref 36.0–46.0)
Hemoglobin: 9.8 g/dL — ABNORMAL LOW (ref 12.0–15.0)
MCH: 25.5 pg — ABNORMAL LOW (ref 26.0–34.0)
MCHC: 30.2 g/dL (ref 30.0–36.0)
MCV: 84.4 fL (ref 80.0–100.0)
Platelets: 201 10*3/uL (ref 150–400)
RBC: 3.85 MIL/uL — ABNORMAL LOW (ref 3.87–5.11)
RDW: 16.5 % — ABNORMAL HIGH (ref 11.5–15.5)
WBC: 11.7 10*3/uL — ABNORMAL HIGH (ref 4.0–10.5)
nRBC: 0 % (ref 0.0–0.2)

## 2023-10-23 LAB — RPR: RPR Ser Ql: NONREACTIVE

## 2023-10-23 LAB — TYPE AND SCREEN
ABO/RH(D): O NEG
Antibody Screen: NEGATIVE

## 2023-10-23 MED ORDER — PENICILLIN G POT IN DEXTROSE 60000 UNIT/ML IV SOLN
3.0000 10*6.[IU] | INTRAVENOUS | Status: DC
  Administered 2023-10-23 – 2023-10-24 (×6): 3 10*6.[IU] via INTRAVENOUS
  Filled 2023-10-23 (×6): qty 50

## 2023-10-23 MED ORDER — ONDANSETRON HCL 4 MG/2ML IJ SOLN: 4.0000 mg | Freq: Four times a day (QID) | INTRAMUSCULAR | Status: DC | PRN

## 2023-10-23 MED ORDER — LACTATED RINGERS IV SOLN: 500.0000 mL | INTRAVENOUS | Status: AC | PRN

## 2023-10-23 MED ORDER — MISOPROSTOL 50MCG HALF TABLET
50.0000 ug | ORAL_TABLET | Freq: Once | ORAL | Status: AC
Start: 2023-10-23 — End: 2023-10-23
  Administered 2023-10-23: 50 ug via ORAL
  Filled 2023-10-23: qty 1

## 2023-10-23 MED ORDER — SODIUM CHLORIDE 0.9 % IV SOLN
5.0000 10*6.[IU] | Freq: Once | INTRAVENOUS | Status: AC
  Administered 2023-10-23: 5 10*6.[IU] via INTRAVENOUS
  Filled 2023-10-23: qty 5

## 2023-10-23 MED ORDER — MISOPROSTOL 25 MCG QUARTER TABLET
25.0000 ug | ORAL_TABLET | Freq: Once | ORAL | Status: AC
  Administered 2023-10-23: 25 ug via VAGINAL
  Filled 2023-10-23: qty 1

## 2023-10-23 MED ORDER — OXYTOCIN-SODIUM CHLORIDE 30-0.9 UT/500ML-% IV SOLN
2.5000 [IU]/h | INTRAVENOUS | Status: DC
  Filled 2023-10-23: qty 500

## 2023-10-23 MED ORDER — MISOPROSTOL 50MCG HALF TABLET
50.0000 ug | ORAL_TABLET | ORAL | Status: DC | PRN
  Administered 2023-10-23 – 2023-10-24 (×2): 50 ug via VAGINAL
  Filled 2023-10-23 (×3): qty 1

## 2023-10-23 MED ORDER — LACTATED RINGERS IV SOLN: INTRAVENOUS | Status: AC

## 2023-10-23 MED ORDER — OXYTOCIN BOLUS FROM INFUSION
333.0000 mL | Freq: Once | INTRAVENOUS | Status: AC
  Administered 2023-10-24: 333 mL via INTRAVENOUS

## 2023-10-23 MED ORDER — FERROUS SULFATE 325 (65 FE) MG PO TABS
325.0000 mg | ORAL_TABLET | ORAL | Status: DC
  Administered 2023-10-26: 325 mg via ORAL
  Filled 2023-10-23 (×2): qty 1

## 2023-10-23 MED ORDER — MISOPROSTOL 50MCG HALF TABLET
50.0000 ug | ORAL_TABLET | ORAL | Status: DC | PRN
  Administered 2023-10-23 (×2): 50 ug via ORAL
  Filled 2023-10-23 (×2): qty 1

## 2023-10-23 MED ORDER — FLEET ENEMA RE ENEM: 1.0000 | ENEMA | RECTAL | Status: DC | PRN

## 2023-10-23 MED ORDER — OXYCODONE-ACETAMINOPHEN 5-325 MG PO TABS: 2.0000 | ORAL_TABLET | ORAL | Status: DC | PRN

## 2023-10-23 MED ORDER — LIDOCAINE HCL (PF) 1 % IJ SOLN: 30.0000 mL | INTRAMUSCULAR | Status: DC | PRN

## 2023-10-23 MED ORDER — ACETAMINOPHEN 325 MG PO TABS: 650.0000 mg | ORAL_TABLET | ORAL | Status: DC | PRN

## 2023-10-23 MED ORDER — OXYCODONE-ACETAMINOPHEN 5-325 MG PO TABS: 1.0000 | ORAL_TABLET | ORAL | Status: DC | PRN

## 2023-10-23 MED ORDER — SOD CITRATE-CITRIC ACID 500-334 MG/5ML PO SOLN: 30.0000 mL | ORAL | Status: DC | PRN

## 2023-10-23 MED ORDER — TERBUTALINE SULFATE 1 MG/ML IJ SOLN: 0.2500 mg | Freq: Once | INTRAMUSCULAR | Status: DC | PRN

## 2023-10-23 NOTE — Progress Notes (Signed)
 Labor Progress Note MAISEE VOLLMAN is a 38 y.o. [phone] at [redacted]w[redacted]d presented for IOL LGA, AMA  S: Patient doing well, intermittently feeling contractions.   O:  BP 118/60 (BP Location: Left Arm)   Pulse 95   Temp 98.2 F (36.8 C) (Oral)   Resp 18   Ht 5' 7 (1.702 m)   Wt (!) 150.6 kg   LMP 01/22/2023   BMI 52.00 kg/m  EFM: baseline 150 bpm/ moderate variability/ + accels/ no decels  Toco/IUPC: q 7 min SVE: Dilation: Fingertip Effacement (%): Thick Station: Ballotable Presentation: Vertex (By US ) Exam by:: Chelsea RN Pitocin: 0 mu/min  A/P: 38 y.o. G4P3003 [redacted]w[redacted]d  1. Labor: SVE FT/th/hi. Dcytotec x 1 at 1130. Will plan for buccal cytotec now.  2. FWB: Cat 1 3. Pain: Tolerable 4. GBS pos > PCN   Anticipate SVD  Wells DELENA Sharps, MD 3:26 PM

## 2023-10-23 NOTE — H&P (Cosign Needed Addendum)
 OBSTETRIC ADMISSION HISTORY AND PHYSICAL  Joanna Neal is a 38 y.o. female [phone] with IUP at [redacted]w[redacted]d by LMP c/w 8 wk US  presenting for IOL LGA, AMA. She reports +FMs, No LOF, no VB, no blurry vision, headaches or peripheral edema, and RUQ pain. Denies contractions.  She plans on breast feeding. She request nexplanon for birth control. She received her prenatal care at 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  -Hx T2DM  -Rh negative   NURSING  PROVIDER  Office Location [facility] Dating by LMP c/w U/S at 8 wks  PNC Model Traditional Anatomy U/S Normal  Initiated care at  [facility]  English              LAB RESULTS   Support Person HUSBAND Genetics NIPS: Low risk female AFP: neg    NT/IT (FT only)     Carrier Screen Horizon: SMA 2 copies  Rhogam  O Neg - Given 08/16/2023 A1C/GTT Early: 5.5 > normal Third trimester:   Flu Vaccine  No    TDaP Vaccine  Declined 08-02-23 Blood Type --/--/O NEG (06/24 0745)  Covid Vaccine  Covid Antibody NEG (06/24 0745)  RSV Vaccine  Rubella <0.90 (11/22 0940) NON-IMMUNE  Feeding Plan breast RPR CANCELED (04/11 0905)  Contraception Nexplanon HBsAg Negative (11/22 0940)  Circumcision Yes  HIV CANCELED (04/11 0905)  Pediatrician   Undecided HCVAb Non Reactive (11/22 0940)  Prenatal Classes     BTL Consent  Pap Diagnosis  Date Value Ref Range Status  04/06/2023   Final   - Negative for intraepithelial lesion or malignancy (NILM)    BTL Pre-payment  GC/CT Initial:   36wks:    VBAC Consent  GBS Positive/-- (06/05 0932)POSITIVE For PCN allergy, check sensitivities        DME Rx [X]  BP cuff [ ]  Weight Scale Waterbirth  [ ]  Class [ ]  Consent [ ]  CNM visit  PHQ9 & GAD7 [X]  new OB [ x ] 28 weeks   [  ] 36 weeks Induction  [ ]  Orders Entered [ ] Foley Y/N      Past Medical History: No past medical history on file.  Past Surgical History: Past Surgical History:  Procedure Laterality Date   NO PAST SURGERIES     WISDOM TOOTH EXTRACTION      Obstetrical History: OB History     Gravida  4   Para  3   Term  3   Preterm  0   AB  0   Living  3      SAB  0   IAB  0   Ectopic  0   Multiple  0   Live Births  3           Social History Social History   Socioeconomic History   Marital status: Married    Spouse name: Not on file   Number of children: Not on file   Years of education: Not on file   Highest education level: Not on file  Occupational History   Not on file  Tobacco Use   Smoking status: Former    Types: Cigars    Quit date: 2024  Years since quitting: 1.4   Smokeless tobacco: Never  Vaping Use   Vaping status: Former   Substances: Flavoring, Mixture of cannabinoids  Substance and Sexual Activity   Alcohol use: No   Drug use: Not Currently    Types: Marijuana   Sexual activity: Yes  Other Topics Concern   Not on file  Social History Narrative   ** Merged History Encounter **       Social Drivers of Health   Financial Resource Strain: Not on file  Food Insecurity: No Food Insecurity (10/23/2023)   Hunger Vital Sign    Worried About Running Out of Food in the Last Year: Never true    Ran Out of Food in the Last Year: Never true  Transportation Needs: No Transportation Needs (10/23/2023)   PRAPARE - Administrator, Civil Service (Medical): No    Lack of Transportation (Non-Medical): No  Physical Activity: Not on file  Stress: Not on file  Social Connections: Unknown (09/13/2021)   Received from [facility]   Social Network    Social Network: Not on file    Family History: Family History  Problem Relation Age of Onset   Hypertension Mother    Asthma Sister    Asthma Son    Cancer Maternal Grandmother     Diabetes Neg Hx    Heart disease Neg Hx    Kidney disease Neg Hx     Allergies: No Known Allergies  Medications Prior to Admission  Medication Sig Dispense Refill Last Dose/Taking   Accu-Chek Softclix Lancets lancets Please check your blood sugar when you wake up before eating (a fasting level) then check your blood sugar 2 hours after each meal (breakfast, lunch, and dinner). (Patient not taking: Reported on 10/17/2023) 100 each 12    aspirin  EC 81 MG tablet Take 1 tablet (81 mg total) by mouth daily. Swallow whole. 30 tablet 12    Blood Glucose Monitoring Suppl (ACCU-CHEK GUIDE) w/Device KIT 1 Device by Does not apply route 4 (four) times daily. Please check your blood sugar when you wake up before eating (a fasting level) then check your blood sugar 2 hours after each meal (breakfast, lunch, and dinner). (Patient not taking: Reported on 10/17/2023) 1 kit 0    Blood Pressure Monitoring (BLOOD PRESSURE KIT) DEVI 1 Device by Does not apply route once a week. (Patient not taking: Reported on 10/17/2023) 1 each 0    cyclobenzaprine  (FLEXERIL ) 10 MG tablet Take 1 tablet (10 mg total) by mouth every 8 (eight) hours as needed for muscle spasms. (Patient not taking: Reported on 10/17/2023) 20 tablet 0    docusate sodium  (COLACE) 100 MG capsule Take 1 capsule (100 mg total) by mouth 2 (two) times daily. (Patient not taking: Reported on 10/17/2023) 60 capsule 0    Doxylamine -Pyridoxine  (DICLEGIS ) 10-10 MG TBEC Take 2 tablets by mouth at bedtime. If symptoms persist, add one tablet in the morning and one in the afternoon (Patient not taking: Reported on 10/17/2023) 100 tablet 5    ferrous sulfate  325 (65 FE) MG EC tablet Take 1 tablet (325 mg total) by mouth every other day. (Patient not taking: Reported on 10/17/2023) 45 tablet 1    glucose blood (ACCU-CHEK GUIDE TEST) test strip Please check your blood sugar when you wake up before eating (a fasting level) then check your blood sugar 2 hours after each meal  (breakfast, lunch, and dinner). (Patient not taking: Reported on 10/17/2023) 100 each 12  omeprazole (PRILOSEC) 20 MG capsule Take 20 mg by mouth daily. (Patient not taking: Reported on 10/17/2023)      polyethylene glycol (MIRALAX ) 17 g packet Take 17 g by mouth daily as needed. (Patient not taking: Reported on 10/17/2023) 14 each 0    Prenat-FeCbn-FeAsp-Meth-FA-DHA (PRENATE MINI ) 18-0.6-0.4-350 MG CAPS Take 1 tablet by mouth daily. 30 capsule 11    Prenatal MV & Min w/FA-DHA (PRENATAL ADULT GUMMY/DHA/FA) 0.4-25 MG CHEW Chew 1 Units by mouth daily. (Patient not taking: Reported on 10/17/2023) 90 tablet 2      Review of Systems   All systems reviewed and negative except as stated in HPI  Blood pressure (!) 118/58, pulse 91, temperature 98 F (36.7 C), temperature source Oral, resp. rate 18, height 5' 7 (1.702 m), weight (!) 150.6 kg, last menstrual period 01/22/2023. General appearance: alert, cooperative, appears stated age, and no distress Lungs: clear to auscultation bilaterally Heart: regular rate and rhythm Abdomen: soft, non-tender; bowel sounds normal Pelvic: SVE FT/th/-3 per RN exam Extremities: Homans sign is negative, no sign of DVT Presentation: cephalic, will confirm with bedside US  Fetal monitoringBaseline: 150 bpm, Variability: Good {> 6 bpm), Accelerations: Reactive, and Decelerations: Absent Uterine activityNone     Prenatal labs: ABO, Rh: --/--/O NEG (06/24 0745) Antibody: NEG (06/24 0745) Rubella: <0.90 (11/22 0940) RPR: CANCELED (04/11 0905)  HBsAg: Negative (11/22 0940)  HIV: CANCELED (04/11 9094)  GBS: Positive/-- (06/05 0932)    Lab Results  Component Value Date   GBS Positive (A) 10/04/2023    There is no immunization history on file for this patient.  Prenatal Transfer Tool  Maternal Diabetes: Yes:  Diabetes Type:  Diet controlled Genetic Screening: Normal Maternal Ultrasounds/Referrals: Normal Fetal Ultrasounds or other Referrals:  None Maternal  Substance Abuse:  No Significant Maternal Medications:  None Significant Maternal Lab Results: Group B Strep positive Number of Prenatal Visits:greater than 3 verified prenatal visits Maternal Vaccinations: Declined TDAP and flu. Received Covid. Other Comments:  None   Results for orders placed or performed during the hospital encounter of 10/23/23 (from the past 24 hours)  CBC   Collection Time: 10/23/23  7:21 AM  Result Value Ref Range   WBC 11.7 (H) 4.0 - 10.5 K/uL   RBC 3.85 (L) 3.87 - 5.11 MIL/uL   Hemoglobin 9.8 (L) 12.0 - 15.0 g/dL   HCT 67.4 (L) 63.9 - 53.9 %   MCV 84.4 80.0 - 100.0 fL   MCH 25.5 (L) 26.0 - 34.0 pg   MCHC 30.2 30.0 - 36.0 g/dL   RDW 83.4 (H) 88.4 - 84.4 %   Platelets 201 150 - 400 K/uL   nRBC 0.0 0.0 - 0.2 %  Type and screen   Collection Time: 10/23/23  7:45 AM  Result Value Ref Range   ABO/RH(D) O NEG    Antibody Screen NEG    Sample Expiration      10/26/2023,2359 Performed at [facility], 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 G4P3003 at [redacted]w[redacted]d here for IOL LGA, AMA.  #Labor: SVE Ft/th/hi. Will begin augmentation with Dcytotec.  #Pain: Tolerable. Desire epidural in  the future. #FWB: Cat 1 #GBS status:  Positive > PCN ordered #Feeding: Breastmilk  #Reproductive Life planning: Nexplanon #Circ:  yes  #T2DM -EFW 3816 g, 89% based on growth US  at 38 wks  #Rh negative  -s/p rhogam   #Obesity  -BMI 52  #Bipolar #Depression  -Mood stable, on no medications  Wells DELENA Sharps, MD  10/23/2023, 11:14 AM  Attestation of Supervision of Student:  I confirm that I have verified the information documented in the resident's note and that I have also personally directly supervised the history, physical exam and all medical decision making activities.  I have verified that all services and findings are accurately documented in this student's note; and I agree with management and plan as outlined in the documentation. I have also made any necessary editorial changes.  Almarie CHRISTELLA Moats, MD OB Fellow 10/23/2023 3:59 PM

## 2023-10-24 ENCOUNTER — Encounter (HOSPITAL_COMMUNITY): Payer: Self-pay | Admitting: Obstetrics and Gynecology

## 2023-10-24 ENCOUNTER — Inpatient Hospital Stay (HOSPITAL_COMMUNITY): Payer: MEDICAID | Admitting: Anesthesiology

## 2023-10-24 MED ORDER — PHENYLEPHRINE 80 MCG/ML (10ML) SYRINGE FOR IV PUSH (FOR BLOOD PRESSURE SUPPORT): 80.0000 ug | PREFILLED_SYRINGE | INTRAVENOUS | Status: DC | PRN

## 2023-10-24 MED ORDER — PRENATAL MULTIVITAMIN CH
1.0000 | ORAL_TABLET | Freq: Every day | ORAL | Status: DC
  Administered 2023-10-25: 1 via ORAL
  Filled 2023-10-24: qty 1

## 2023-10-24 MED ORDER — WITCH HAZEL-GLYCERIN EX PADS
1.0000 | MEDICATED_PAD | CUTANEOUS | Status: DC | PRN
Start: 2023-10-24 — End: 2023-10-26

## 2023-10-24 MED ORDER — LACTATED RINGERS IV SOLN: 500.0000 mL | Freq: Once | INTRAVENOUS | Status: DC

## 2023-10-24 MED ORDER — MISOPROSTOL 200 MCG PO TABS
ORAL_TABLET | ORAL | Status: AC
  Administered 2023-10-24: 800 ug
  Filled 2023-10-24: qty 4

## 2023-10-24 MED ORDER — LIDOCAINE HCL (PF) 1 % IJ SOLN
INTRAMUSCULAR | Status: DC | PRN
  Administered 2023-10-24: 8 mL via EPIDURAL

## 2023-10-24 MED ORDER — TETANUS-DIPHTH-ACELL PERTUSSIS 5-2.5-18.5 LF-MCG/0.5 IM SUSY: 0.5000 mL | PREFILLED_SYRINGE | Freq: Once | INTRAMUSCULAR | Status: DC

## 2023-10-24 MED ORDER — ZOLPIDEM TARTRATE 5 MG PO TABS: 5.0000 mg | ORAL_TABLET | Freq: Every evening | ORAL | Status: DC | PRN

## 2023-10-24 MED ORDER — BUPIVACAINE HCL (PF) 0.25 % IJ SOLN
INTRAMUSCULAR | Status: DC | PRN
  Administered 2023-10-24: 8 mL via EPIDURAL

## 2023-10-24 MED ORDER — EPHEDRINE 5 MG/ML INJ
10.0000 mg | INTRAVENOUS | Status: DC | PRN
Start: 2023-10-24 — End: 2023-10-24

## 2023-10-24 MED ORDER — OXYTOCIN-SODIUM CHLORIDE 30-0.9 UT/500ML-% IV SOLN
1.0000 m[IU]/min | INTRAVENOUS | Status: DC
  Administered 2023-10-24: 2 m[IU]/min via INTRAVENOUS

## 2023-10-24 MED ORDER — EPHEDRINE 5 MG/ML INJ: 10.0000 mg | INTRAVENOUS | Status: DC | PRN

## 2023-10-24 MED ORDER — DIPHENHYDRAMINE HCL 50 MG/ML IJ SOLN: 12.5000 mg | INTRAMUSCULAR | Status: DC | PRN

## 2023-10-24 MED ORDER — COCONUT OIL OIL
1.0000 | TOPICAL_OIL | Status: DC | PRN
Start: 2023-10-24 — End: 2023-10-26

## 2023-10-24 MED ORDER — LACTATED RINGERS IV SOLN: 500.0000 mL | INTRAVENOUS | Status: DC | PRN

## 2023-10-24 MED ORDER — LACTATED RINGERS IV SOLN: INTRAVENOUS | Status: DC

## 2023-10-24 MED ORDER — IBUPROFEN 600 MG PO TABS
600.0000 mg | ORAL_TABLET | Freq: Four times a day (QID) | ORAL | Status: DC
Start: 2023-10-24 — End: 2023-10-26
  Administered 2023-10-24 – 2023-10-26 (×8): 600 mg via ORAL
  Filled 2023-10-24 (×8): qty 1

## 2023-10-24 MED ORDER — ONDANSETRON HCL 4 MG/2ML IJ SOLN: 4.0000 mg | INTRAMUSCULAR | Status: DC | PRN

## 2023-10-24 MED ORDER — ACETAMINOPHEN 325 MG PO TABS
650.0000 mg | ORAL_TABLET | ORAL | Status: DC | PRN
  Administered 2023-10-24: 650 mg via ORAL
  Filled 2023-10-24: qty 2

## 2023-10-24 MED ORDER — DIPHENHYDRAMINE HCL 25 MG PO CAPS: 25.0000 mg | ORAL_CAPSULE | Freq: Four times a day (QID) | ORAL | Status: DC | PRN

## 2023-10-24 MED ORDER — FENTANYL-BUPIVACAINE-NACL 0.5-0.125-0.9 MG/250ML-% EP SOLN
12.0000 mL/h | EPIDURAL | Status: DC | PRN
  Administered 2023-10-24: 12 mL/h via EPIDURAL
  Filled 2023-10-24: qty 250

## 2023-10-24 MED ORDER — TRANEXAMIC ACID-NACL 1000-0.7 MG/100ML-% IV SOLN
INTRAVENOUS | Status: AC
  Administered 2023-10-24: 1000 mg
  Filled 2023-10-24: qty 100

## 2023-10-24 MED ORDER — DIBUCAINE (PERIANAL) 1 % EX OINT: 1.0000 | TOPICAL_OINTMENT | CUTANEOUS | Status: DC | PRN

## 2023-10-24 MED ORDER — BENZOCAINE-MENTHOL 20-0.5 % EX AERO
1.0000 | INHALATION_SPRAY | CUTANEOUS | Status: DC | PRN
Start: 2023-10-24 — End: 2023-10-26
  Administered 2023-10-24: 1 via TOPICAL
  Filled 2023-10-24: qty 56

## 2023-10-24 MED ORDER — FENTANYL CITRATE (PF) 100 MCG/2ML IJ SOLN
INTRAMUSCULAR | Status: DC | PRN
  Administered 2023-10-24: 100 ug via EPIDURAL

## 2023-10-24 MED ORDER — RHO D IMMUNE GLOBULIN 1500 UNIT/2ML IJ SOSY
300.0000 ug | PREFILLED_SYRINGE | Freq: Once | INTRAMUSCULAR | Status: AC
  Administered 2023-10-25: 300 ug via INTRAVENOUS
  Filled 2023-10-24: qty 2

## 2023-10-24 MED ORDER — FENTANYL CITRATE (PF) 100 MCG/2ML IJ SOLN
INTRAMUSCULAR | Status: AC
  Filled 2023-10-24: qty 2

## 2023-10-24 MED ORDER — SIMETHICONE 80 MG PO CHEW: 80.0000 mg | CHEWABLE_TABLET | ORAL | Status: DC | PRN

## 2023-10-24 MED ORDER — ONDANSETRON HCL 4 MG PO TABS: 4.0000 mg | ORAL_TABLET | ORAL | Status: DC | PRN

## 2023-10-24 MED ORDER — FENTANYL-BUPIVACAINE-NACL 0.5-0.125-0.9 MG/250ML-% EP SOLN: 12.0000 mL/h | EPIDURAL | Status: DC | PRN

## 2023-10-24 MED ORDER — SENNOSIDES-DOCUSATE SODIUM 8.6-50 MG PO TABS
2.0000 | ORAL_TABLET | Freq: Every day | ORAL | Status: DC
  Administered 2023-10-26: 2 via ORAL
  Filled 2023-10-24 (×2): qty 2

## 2023-10-24 MED ORDER — PHENYLEPHRINE 80 MCG/ML (10ML) SYRINGE FOR IV PUSH (FOR BLOOD PRESSURE SUPPORT)
80.0000 ug | PREFILLED_SYRINGE | INTRAVENOUS | Status: DC | PRN
Start: 2023-10-24 — End: 2023-10-24

## 2023-10-24 NOTE — Lactation Note (Signed)
 This note was copied from a baby's chart. Lactation Consultation Note  Patient Name: Joanna Neal Unijb'd Date: 10/24/2023 Age:36 hours Reason for consult: Follow-up assessment;Mother's request;Term;Breastfeeding assistance  P4- MOB called out for a latch assessment as requested by the LC. LC assisted with placing infant on the left breast in the football hold with two pillows for support. Infant was eager and latched immediatly. Infant had a wide, deep latch with flanged lips and a rhythmic suck. LC praised MOB and infant. MOB wanted LC to try hand expression on the right breast to confirm that there was colostrum. LC expressed once a multiple large drops of colostrum started rolling down her breast. MOB reported feeling relieved. LC encouraged MOB to call for further assistance as needed. Infant was still nursing when [facility] left room. LC watched infant nurse for 10 minutes.   Maternal Data Has patient been taught Hand Expression?: Yes Does the patient have breastfeeding experience prior to this delivery?: Yes How long did the patient breastfeed?: 1 week per older child  Feeding Mother's Current Feeding Choice: Breast Milk  LATCH Score Latch: Repeated attempts needed to sustain latch, nipple held in mouth throughout feeding, stimulation needed to elicit sucking reflex.  Audible Swallowing: A few with stimulation  Type of Nipple: Everted at rest and after stimulation  Comfort (Breast/Nipple): Soft / non-tender  Hold (Positioning): Assistance needed to correctly position infant at breast and maintain latch.  LATCH Score: 7   Lactation Tools Discussed/Used Pump Education: Milk Storage  Interventions Interventions: Breast feeding basics reviewed;Assisted with latch;Hand express;Breast compression;Adjust position;Support pillows;Position options;Education;LC Services brochure  Discharge Discharge Education: Engorgement and breast care;Warning signs for feeding baby Pump: Hands  Free;Personal  Consult Status Consult Status: Follow-up Date: 10/25/23 Follow-up type: In-patient    Recardo Hoit BS, IBCLC 10/24/2023, 10:25 PM

## 2023-10-24 NOTE — Progress Notes (Signed)
 LABOR PROGRESS NOTE  Patient Name: Joanna Neal, female   DOB: Jan 03, 1986, 38 y.o.  MRN: [id number]  Cat 1 strip, mom would still like to avoid FB.  Will try vaginal cytotec this round to see if better results.    Augustin JAYSON Slade, MD

## 2023-10-24 NOTE — Discharge Summary (Signed)
 Postpartum Discharge Summary     Patient Name: Joanna Neal DOB: 1986/01/12 MRN: [id number]  Date of admission: 10/23/2023 Delivery date:10/24/2023 Delivering provider: KANDIS ASA BEDFORD Date of discharge: 10/26/2023  Admitting diagnosis: Diet controlled gestational diabetes mellitus [O24.410] Intrauterine pregnancy: [redacted]w[redacted]d     Secondary diagnosis:  Active Problems:   Bipolar affective disorder, mixed, severe (HCC)   Rubella non-immune status, antepartum   BMI 40.0-44.9, adult (HCC)   Rh negative state in antepartum period  Additional problems:  -Bipolar disorder -Depression  -GERD -Rubella non-immune  -AMA  -Obesity -SMA carrier  -Hx T2DM  -Rh negative    Discharge diagnosis: Term Pregnancy Delivered                                              Post partum procedures:rhogam Augmentation: AROM and Cytotec  Complications: None  Hospital course:  Patient is a 38 yo F G4P3 initially presenting for IOL LGA, AMA. Augmentation with cytotec  and AROM. GBS positive and received adequate intrapartum antibiotics.   Magnesium Sulfate received: No BMZ received: No Rhophylac :No MMR:N/A T-DaP:Declined Flu: No RSV Vaccine received: No Transfusion:No  Immunizations received: There is no immunization history for the selected administration types on file for this patient.  Physical exam  Vitals:   10/25/23 1030 10/25/23 1652 10/25/23 2037 10/26/23 0516  BP: 130/65 121/69 107/62 101/60  Pulse: 77 79 71 75  Resp:  17 16 16   Temp:  98.2 F (36.8 C) 98.1 F (36.7 C) 98 F (36.7 C)  TempSrc:  Oral Oral Oral  SpO2:  99% 100% 98%  Weight:      Height:       General: alert, cooperative, and no distress Lochia: appropriate Uterine Fundus: firm Incision: N/A DVT Evaluation: No significant calf/ankle edema. Labs: Lab Results  Component Value Date   WBC 12.3 (H) 10/25/2023   HGB 9.2 (L) 10/25/2023   HCT 30.3 (L) 10/25/2023   MCV 82.6 10/25/2023   PLT 193 10/25/2023       Latest Ref Rng & Units 10/04/2023    9:38 AM  CMP  Glucose 70 - 99 mg/dL 87   BUN 6 - 20 mg/dL 6   Creatinine 9.42 - 8.99 mg/dL 9.33   Sodium 865 - 855 mmol/L 136   Potassium 3.5 - 5.2 mmol/L 4.3   Chloride 96 - 106 mmol/L 103   CO2 20 - 29 mmol/L 20   Calcium 8.7 - 10.2 mg/dL 8.6   Total Protein 6.0 - 8.5 g/dL 5.7   Total Bilirubin 0.0 - 1.2 mg/dL 0.3   Alkaline Phos 44 - 121 IU/L 133   AST 0 - 40 IU/L 16   ALT 0 - 32 IU/L 18    Edinburgh Score:    10/25/2023   10:21 AM  Edinburgh Postnatal Depression Scale Screening Tool  I have been able to laugh and see the funny side of things. 0  I have looked forward with enjoyment to things. 0  I have blamed myself unnecessarily when things went wrong. 1  I have been anxious or worried for no good reason. 0  I have felt scared or panicky for no good reason. 1  Things have been getting on top of me. 0  I have been so unhappy that I have had difficulty sleeping. 0  I have felt sad or miserable. 0  I have been  so unhappy that I have been crying. 0  The thought of harming myself has occurred to me. 0  Edinburgh Postnatal Depression Scale Total 2   Edinburgh Postnatal Depression Scale Total: 2   After visit meds:  Allergies as of 10/26/2023   No Known Allergies      Medication List     STOP taking these medications    Accu-Chek Guide Test test strip Generic drug: glucose blood   Accu-Chek Guide w/Device Kit   Accu-Chek Softclix Lancets lancets   aspirin  EC 81 MG tablet   Blood Pressure Kit Devi   cyclobenzaprine  10 MG tablet Commonly known as: FLEXERIL    docusate sodium  100 MG capsule Commonly known as: COLACE   Doxylamine -Pyridoxine  10-10 MG Tbec Commonly known as: Diclegis    omeprazole 20 MG capsule Commonly known as: PRILOSEC   polyethylene glycol 17 g packet Commonly known as: MiraLax    Prenatal Adult Gummy/DHA/FA 0.4-25 MG Chew       TAKE these medications    benzocaine -Menthol  20-0.5 %  Aero Commonly known as: DERMOPLAST Apply 1 Application topically as needed for irritation (perineal discomfort).   ferrous sulfate  325 (65 FE) MG EC tablet Take 1 tablet (325 mg total) by mouth every other day.   ibuprofen  600 MG tablet Commonly known as: ADVIL  Take 1 tablet (600 mg total) by mouth every 6 (six) hours.   Prenate Mini  18-0.6-0.4-350 MG Caps Take 1 tablet by mouth daily.   senna-docusate 8.6-50 MG tablet Commonly known as: Senokot-S Take 2 tablets by mouth daily.         Discharge home in stable condition Infant Feeding: Breast Infant Disposition:home with mother Discharge instruction: per After Visit Summary and Postpartum booklet. Activity: Advance as tolerated. Pelvic rest for 6 weeks.  Diet: routine diet Future Appointments: Future Appointments  Date Time Provider Department Center  12/05/2023 10:15 AM Rudy Carlin LABOR, MD [facility] None   Follow up Visit:  Message sent to [facility] 6/25  Please schedule this patient for a In person postpartum visit in 6 weeks with the following provider: Any provider. Additional Postpartum F/U:N/A  High risk pregnancy complicated by: AMA, Rh negative, bipolar, Delivery mode:  Vaginal, Spontaneous Anticipated Birth Control:  Nexplanon    10/26/2023 Alain Sor, MD

## 2023-10-24 NOTE — Lactation Note (Signed)
 This note was copied from a baby's chart. Lactation Consultation Note  Patient Name: Joanna Neal Unijb'd Date: 10/24/2023 Age:36 hours Reason for consult: Initial assessment;Term  P4- MOB reports that infant is latching well so far. MOB reports some discomfort, so LC reviewed how to elicit a gaping mouth before latching infant, how to position infant belly to belly, and how to flange infant's lips. MOB reports being very determined to breastfeed this child. MOB reports that she was only able to breast feed her first three children for 1 week each because it hurt too much and she didn't produce enough. MOB informed LC that at 1 week pp, she was only pumping 2 ounces her pump session. LC praised MOB for that amount and reassured her that this is a lot for 1 week pp. LC reviewed the difference between colostrum, transitional milk, mature milk and established mature milk. LC reviewed the time frame expect for these; colostrum (first few days), transitional/mature milk (between 2-7 days) and established mature milk (around 3-4 weeks pp). LC reviewed the possibility of exclusive pumping as well if she feels like breastfeeding on the breast is too much. MOB reports having a hands free pump given by her insurance. MOB has very large breasts and expresses concern that the pump did not fit her when she tried them on. LC explained how unfortunately hands free pumps are usually not the best option for moms with large breasts because they di not fit right. LC reviewed different DEBP options she may be interested in as well.  LC reviewed the first 24 hr birthday nap, day 2 cluster feeding, feeding infant on cue 8-12x in 24 hrs, not allowing infant to go over 3 hrs without a feeding, CDC milk storage guidelines, LC services handout and engorgement/breast care. LC encouraged MOB to call for further assistance as needed.  Maternal Data Has patient been taught Hand Expression?: Yes Does the patient have  breastfeeding experience prior to this delivery?: Yes How long did the patient breastfeed?: About 1 week per older child. See [facility] note for further details.  Feeding Mother's Current Feeding Choice: Breast Milk  Lactation Tools Discussed/Used Pump Education: Milk Storage  Interventions Interventions: Breast feeding basics reviewed;Education;LC Services brochure  Discharge Discharge Education: Engorgement and breast care;Warning signs for feeding baby Pump: Hands Free;Personal  Consult Status Consult Status: Follow-up Date: 10/25/23 Follow-up type: In-patient    Recardo Hoit BS, IBCLC 10/24/2023, 7:58 PM

## 2023-10-24 NOTE — Lactation Note (Signed)
 This note was copied from a baby's chart. Lactation Consultation Note  Patient Name: Boy Litzi Binning Unijb'd Date: 10/24/2023 Age:35 hours   P4- LC attempted to consult with MOB, but she had a sign asking for no one to bother her from 3-7 pm. LC will attempt to see her at a later time.   Recardo Hoit BS, IBCLC 10/24/2023, 6:45 PM

## 2023-10-24 NOTE — Anesthesia Procedure Notes (Signed)
 Epidural Patient location during procedure: OB Start time: 10/24/2023 9:20 AM End time: 10/24/2023 9:25 AM  Staffing Anesthesiologist: Mallory Manus, MD  Preanesthetic Checklist Completed: patient identified, IV checked, site marked, risks and benefits discussed, surgical consent, monitors and equipment checked, pre-op evaluation and timeout performed  Epidural Patient position: sitting Prep: DuraPrep and site prepped and draped Patient monitoring: continuous pulse ox and blood pressure Approach: midline Location: L4-L5 Injection technique: LOR air  Needle:  Needle type: Tuohy  Needle gauge: 17 G Needle length: 9 cm and 9 Needle insertion depth: 8 cm Catheter type: closed end flexible Catheter size: 19 Gauge Catheter at skin depth: 14 cm Test dose: negative  Assessment Events: blood not aspirated, no cerebrospinal fluid, injection not painful, no injection resistance, no paresthesia and negative IV test

## 2023-10-24 NOTE — Anesthesia Preprocedure Evaluation (Signed)
 Anesthesia Evaluation  Patient identified by MRN, date of birth, ID band Patient awake    Reviewed: Allergy & Precautions, H&P , NPO status , Patient's Chart, lab work & pertinent test results, reviewed documented beta blocker date and time   Airway Mallampati: IV  TM Distance: >3 FB Neck ROM: full    Dental no notable dental hx. (+) Teeth Intact, Dental Advisory Given   Pulmonary neg pulmonary ROS, former smoker   Pulmonary exam normal breath sounds clear to auscultation       Cardiovascular negative cardio ROS Normal cardiovascular exam Rhythm:regular Rate:Normal     Neuro/Psych  PSYCHIATRIC DISORDERS  Depression Bipolar Disorder   negative neurological ROS     GI/Hepatic Neg liver ROS,GERD  ,,  Endo/Other  diabetes  Class 3 obesity  Renal/GU negative Renal ROS  negative genitourinary   Musculoskeletal   Abdominal   Peds  Hematology negative hematology ROS (+)   Anesthesia Other Findings   Reproductive/Obstetrics (+) Pregnancy                             Anesthesia Physical Anesthesia Plan  ASA: 3  Anesthesia Plan: Epidural   Post-op Pain Management: Minimal or no pain anticipated   Induction: Intravenous  PONV Risk Score and Plan: 2 and Treatment may vary due to age or medical condition  Airway Management Planned: Natural Airway  Additional Equipment: Fetal Monitoring  Intra-op Plan:   Post-operative Plan:   Informed Consent: I have reviewed the patients History and Physical, chart, labs and discussed the procedure including the risks, benefits and alternatives for the proposed anesthesia with the patient or authorized representative who has indicated his/her understanding and acceptance.       Plan Discussed with: Anesthesiologist and CRNA  Anesthesia Plan Comments:        Anesthesia Quick Evaluation

## 2023-10-24 NOTE — Anesthesia Postprocedure Evaluation (Signed)
 Anesthesia Post Note  Patient: Joanna Neal  Procedure(s) Performed: AN AD HOC LABOR EPIDURAL     Patient location during evaluation: Mother Baby Anesthesia Type: Epidural Level of consciousness: awake and alert Pain management: pain level controlled Vital Signs Assessment: post-procedure vital signs reviewed and stable Respiratory status: spontaneous breathing, nonlabored ventilation and respiratory function stable Cardiovascular status: stable Postop Assessment: no headache, no backache and epidural receding Anesthetic complications: no   No notable events documented.  Last Vitals:  Vitals:   10/24/23 1417 10/24/23 1432  BP: 139/75 123/69  Pulse: 89 97  Resp:    Temp:    SpO2:      Last Pain:  Vitals:   10/24/23 1401  TempSrc:   PainSc: 0-No pain   Pain Goal:                Epidural/Spinal Function Cutaneous sensation: Normal sensation (10/24/23 1401), Patient able to flex knees: Yes (10/24/23 1401), Patient able to lift hips off bed: Yes (10/24/23 1401), Back pain beyond tenderness at insertion site: No (10/24/23 1401), Progressively worsening motor and/or sensory loss: No (10/24/23 1401), Bowel and/or bladder incontinence post epidural: No (10/24/23 1401)  Hoby Kawai

## 2023-10-25 DIAGNOSIS — Z30017 Encounter for initial prescription of implantable subdermal contraceptive: Secondary | ICD-10-CM

## 2023-10-25 LAB — BIRTH TISSUE RECOVERY COLLECTION (PLACENTA DONATION)

## 2023-10-25 LAB — RH IG WORKUP (INCLUDES ABO/RH): Fetal Screen: NEGATIVE

## 2023-10-25 LAB — CBC
HCT: 30.3 % — ABNORMAL LOW (ref 36.0–46.0)
Hemoglobin: 9.2 g/dL — ABNORMAL LOW (ref 12.0–15.0)
MCH: 25.1 pg — ABNORMAL LOW (ref 26.0–34.0)
MCHC: 30.4 g/dL (ref 30.0–36.0)
MCV: 82.6 fL (ref 80.0–100.0)
Platelets: 193 10*3/uL (ref 150–400)
RBC: 3.67 MIL/uL — ABNORMAL LOW (ref 3.87–5.11)
RDW: 16.1 % — ABNORMAL HIGH (ref 11.5–15.5)
WBC: 12.3 10*3/uL — ABNORMAL HIGH (ref 4.0–10.5)
nRBC: 0 % (ref 0.0–0.2)

## 2023-10-25 MED ORDER — LIDOCAINE HCL 1 % IJ SOLN
0.0000 mL | Freq: Once | INTRAMUSCULAR | Status: AC | PRN
  Administered 2023-10-25: 20 mL via INTRADERMAL
  Filled 2023-10-25: qty 20

## 2023-10-25 MED ORDER — ETONOGESTREL 68 MG ~~LOC~~ IMPL
68.0000 mg | DRUG_IMPLANT | Freq: Once | SUBCUTANEOUS | Status: AC
  Administered 2023-10-25: 68 mg via SUBCUTANEOUS
  Filled 2023-10-25: qty 1

## 2023-10-25 NOTE — Progress Notes (Signed)
 CSW received a consult for a hx of Bipolar and depression. CSW met MOB at bedside to complete a mental health assessment and offer support. CSW entered the room, introduced herself and acknowledged she had family present. CSW asked MOB for privacy could her guest step out for the assessment; MOB agreeable and her guest stepped out. CSW explained her role and the reason for the visit. MOB presented bonding/holding the infant and was polite, easy to engage, receptive to meeting with CSW, and appeared forthcoming.  CSW inquired about MOB's mental health history. MOB denied any/all mental health history. CSW asked MOB has she ever received a diagnosis for Bipolar and she indicated no. MOB denied participating in therapy or being prescribed medication for support. CSW asked MOB how she was feeling since the delivery. MOB reported she is feeling good and her other children are very excited for the new infant. MOB reported her supports as her family and FOB. CSW provided education regarding the baby blues period vs. perinatal mood disorders, discussed treatment and gave resources for mental health follow up if concerns arise.  CSW recommends self-evaluation during the postpartum time period using the New Mom Checklist from Postpartum Progress and encouraged MOB to contact a medical professional if symptoms are noted at any time. CSW assessed for safety with MOB SI/HI/DV;MOB denied all.  CSW asked MOB does she receive support resources; MOB said yes(WIC and food stamps). CSW asked MOB has she selected a pediatrician for the infant's follow up visits; MOB said [facility] MOB reported having all essential items for the infant including a carseat; however she does not have a sleep area for the infant and would need support. CSW will deliver a pack and play to MOB at bedside prior to discharge. CSW provided review of Sudden Infant Death Syndrome (SIDS) precautions.  CSW identifies no further need for intervention  and no barriers to discharge at this time.  Joanna Neal, [place] Clinical Social Worker [phone]

## 2023-10-25 NOTE — Lactation Note (Signed)
 This note was copied from a baby's chart. Lactation Consultation Note  Patient Name: Joanna Neal Unijb'd Date: 10/25/2023 Age:36 hours  Reason for consult: Follow-up assessment;Term;Breastfeeding assistance  P4, [redacted]w[redacted]d, 4% weight loss  Mother requesting LC assist with breastfeeding and flange sizing. Baby showing feeding cues. Pillows arranged to align baby to breast and allow mother's breast to rest on pillow. Baby rooting for breast and latched with wide gape. Rhythmic sucking and intermittent swallows noted. Mother was able to see baby swallow. Colostrum was expressed prior to latch and breast compression was taught to keep baby engaged in feeding and increase milk transfer.  Mother desires to breastfeed this baby. She recalls milk production with after other deliveries and pumped a couple of times getting 2 oz. Reassured that was good and an OP appointment will provide more information about infant feeding. Mother has an OP LC appointment scheduled.    Mother is concerned baby has not voided yet. He has stooled several times. Basic breastfeeding education reviewed. Mother encouraged to latch baby with feeding cues, place baby skin to skin if not latching, and call for assistance with breastfeeding as needed. Informed mother regarding cluster feeding as baby approaches 24 hours of age. Baby should want to increase feeding frequency, often breastfeeding 8-12 times in 24 hours and cluster feedings.    Mother assisted with flange fitting with her mom cozy pump. There are several inserts in the pump. Mother appears to be a 17 mm to 19 mm. Insert was placed next the breast. Instructed mother to review instructions of her pump and use the insert that pulls nipple into the tunnel without pain. Size will vary as milk comes to volume, baby grows and the amount of pumping mother does. Her feeding goal is to breast feed and pump to have EBM so family can bottle feed baby too.     Feeding Mother's Current Feeding Choice: Breast Milk  LATCH Score Latch: Grasps breast easily, tongue down, lips flanged, rhythmical sucking.  Audible Swallowing: Spontaneous and intermittent  Type of Nipple: Everted at rest and after stimulation  Comfort (Breast/Nipple): Soft / non-tender  Hold (Positioning): Assistance needed to correctly position infant at breast and maintain latch.  LATCH Score: 9       Interventions Interventions: Breast feeding basics reviewed;Assisted with latch;Skin to skin;Hand express;Breast compression;Support pillows;Adjust position;Position options;Education  Discharge Discharge Education: Other (comment) (mother has an OP LC [facility] post d/c) Pump: Hands Free  Consult Status Consult Status: Follow-up Date: 10/26/23 Follow-up type: In-patient    Joshua Line M 10/25/2023, 11:43 AM

## 2023-10-25 NOTE — Procedures (Signed)
 PRE-OP DIAGNOSIS: desired long-term, reversible contraception   POST-OP DIAGNOSIS: Same   PROCEDURE: Nexplanon  placement  Performing Physician: Shonna Chock, MD   PROCEDURE:  Site (check): left arm        Sterile Preparation:    Betadine        First I obtained written informed consent including discussion of risk of bleeding, infection, and damage to surrounding tissue, as well as discussion of side effects including unscheduled bleeding. Next, a time-out was performed, and then an insertion site was selected 6 - 10 cm from medial epicondyle and marked. Procedure area was prepped and draped in a sterile fashion. 3 mL of 1% lidocaine w/o epinephrine was used for subcutaneous anesthesia. Anesthesia confirmed.  Nexplanon  trocar was inserted subcutaneously and then Nexplanon  capsule delivered subcutaneously. Trocar was removed from the insertion site. Nexplanon  capsule was palpated by provider and patient to assure satisfactory placement.  Estimated blood loss: minimal Dressings applied: steri-strip, band-aid, coband Followup: The patient tolerated the procedure well without complications.  Standard post-procedure care wass explained and return precautions were given.

## 2023-10-25 NOTE — Progress Notes (Signed)
 POSTPARTUM PROGRESS NOTE  Post Partum Day 1 Subjective:  Joanna Neal is a 38 y.o. H5E5995 [redacted]w[redacted]d s/p nsvd.  No acute events overnight.  Pt denies problems with ambulating, voiding or po intake.  She denies nausea or vomiting.  Pain is well controlled.  She has had flatus. She has not had bowel movement.  Lochia Minimal.   Objective: Blood pressure 112/71, pulse 70, temperature 98.5 F (36.9 C), temperature source Oral, resp. rate 16, height 5' 7 (1.702 m), weight (!) 150.6 kg, last menstrual period 01/22/2023, SpO2 99%, unknown if currently breastfeeding.  Physical Exam:  General: alert, cooperative and no distress Lochia:normal flow Chest: CTAB Heart: RRR no m/r/g Abdomen: +BS, soft, nontender,  Uterine Fundus: firm,  DVT Evaluation: No calf swelling or tenderness Extremities: trace edema  Recent Labs    10/23/23 0721 10/25/23 0423  HGB 9.8* 9.2*  HCT 32.5* 30.3*    Assessment/Plan:  ASSESSMENT: Joanna Neal is a 38 y.o. H5E5995 [redacted]w[redacted]d s/p nsvd, doing well. Bleeding appropriate. Breastfeeding. Planning on nexplanon today, consented for circ, will need rhogam. Possible d/c later today if peds ok with that.    LOS: 2 days   Devaughn KATHEE Ban 10/25/2023, 10:10 AM ;o

## 2023-10-26 ENCOUNTER — Other Ambulatory Visit (HOSPITAL_COMMUNITY): Payer: Self-pay

## 2023-10-26 LAB — RH IG WORKUP (INCLUDES ABO/RH)
Gestational Age(Wks): 39.2
Unit division: 0

## 2023-10-26 MED ORDER — SENNOSIDES-DOCUSATE SODIUM 8.6-50 MG PO TABS
2.0000 | ORAL_TABLET | Freq: Every day | ORAL | 0 refills | Status: AC
  Filled 2023-10-26: qty 60, 30d supply, fill #0

## 2023-10-26 MED ORDER — BENZOCAINE-MENTHOL 20-0.5 % EX AERO
1.0000 | INHALATION_SPRAY | CUTANEOUS | 3 refills | Status: AC | PRN
  Filled 2023-10-26: qty 78, fill #0

## 2023-10-26 MED ORDER — IBUPROFEN 600 MG PO TABS
600.0000 mg | ORAL_TABLET | Freq: Four times a day (QID) | ORAL | 0 refills | Status: AC
  Filled 2023-10-26: qty 30, 8d supply, fill #0

## 2023-10-26 NOTE — Lactation Note (Addendum)
 This note was copied from a baby's chart. Lactation Consultation Note  Patient Name: Joanna Neal Unijb'd Date: 10/26/2023 Age:36 hours  Reason for consult: Follow-up assessment;Term  P4, [redacted]w[redacted]d, 45 weight loss  Mother states that she exclusively breast fed up to 3 pm yesterday but baby did not void. Parents supplemented with formula and baby started voiding frequently. She reports that Joanna Neal will latch well and appears to enjoy breastfeeding but she is not making milk yet.   Mother did not breast feed her other 3 boys past 1 week but recalls milk production coming in. Mother plans to continue to work on breastfeeding every 3 hours or more once home. She has a wearable breast pump.  A referral was sent to [facility] requesting mother get a DEBP to help mother establish her milk supply.    Discussed the process of milk production, supply and demand and the importance of breast stimulation and milk removal in order to make an optimal milk supply.  Discussed mother to breastfeed 8-12 times in 24 hours, skin to skin and breast feed before formula feeding.   If missed feedings at breast or substituting feeding with formula, advised to hand express and/or pump to remove milk from the breast.   Mother verbalized understanding. She has been latching baby before bottle supplementing and placing baby to breast after breastfeeding. Baby is out of the room, having his circumcision.   Mother has an OP LC appointment scheduled with [facility].    Feeding Mother's Current Feeding Choice: Breast Milk and Formula    Discharge Discharge Education: Engorgement and breast care;Warning signs for feeding baby;Outpatient recommendation Pump: Hands Free  Consult Status Consult Status: Complete Date: 10/26/23    Joshua Rojelio HERO 10/26/2023, 12:12 PM

## 2023-10-26 NOTE — Progress Notes (Signed)
 Patient refused MMR shot.

## 2023-10-31 ENCOUNTER — Telehealth: Payer: Self-pay

## 2023-10-31 NOTE — Telephone Encounter (Signed)
 Contacted pt after receiving after hours alert related to swelling. Pt states that swelling has gotten better and she has been elevating her feet. Pt states that she has been having intense pelvic pain/pressure and is going to go back to the hospital. Advised pt to go to [facility], pt agreed.

## 2023-11-05 ENCOUNTER — Telehealth (HOSPITAL_COMMUNITY): Payer: Self-pay | Admitting: *Deleted

## 2023-11-05 NOTE — Telephone Encounter (Signed)
 11/05/2023  Name: ARLETH MCCULLAR MRN: [id number] DOB: 14-Jan-1986  Reason for Call:  Transition of Care Hospital Discharge Call  Contact Status: Patient Contact Status: Unable to contact  Language assistant needed: Interpreter Mode: Interpreter Not Needed        Follow-Up Questions: Do You Have Any Concerns About Your Health As You Heal From Delivery?: No Do You Have Any Concerns About Your Infants Health?: No  Edinburgh Postnatal Depression Scale:  In the Past 7 Days: I have been able to laugh and see the funny side of things.: As much as I always could I have looked forward with enjoyment to things.: As much as I ever did I have blamed myself unnecessarily when things went wrong.: No, never I have been anxious or worried for no good reason.: No, not at all I have felt scared or panicky for no good reason.: No, not at all Things have been getting on top of me.: No, I have been coping as well as ever I have been so unhappy that I have had difficulty sleeping.: Not at all I have felt sad or miserable.: No, not at all I have been so unhappy that I have been crying.: No, never The thought of harming myself has occurred to me.: Never [place] Postnatal Depression Scale Total: 0  PHQ2-9 Depression Scale:     Discharge Follow-up: Edinburgh score requires follow up?: No Patient was advised of the following resources:: Support Group, Breastfeeding Support Group  Post-discharge interventions: Reviewed Newborn Safe Sleep Practices  Mliss Sieve, RN 11/05/2023 13:46

## 2023-11-16 ENCOUNTER — Ambulatory Visit: Payer: MEDICAID | Admitting: Obstetrics

## 2023-12-05 ENCOUNTER — Ambulatory Visit: Payer: MEDICAID | Admitting: Obstetrics
